# Patient Record
Sex: Male | Born: 1975 | Race: White | Hispanic: No | Marital: Single | Smoking: Current every day smoker
Health system: Southern US, Community
[De-identification: ages and names within clinical notes are randomized; demographics above are authoritative.]

## PROBLEM LIST (undated history)

## (undated) DIAGNOSIS — E079 Disorder of thyroid, unspecified: Secondary | ICD-10-CM

## (undated) DIAGNOSIS — R569 Unspecified convulsions: Secondary | ICD-10-CM

## (undated) DIAGNOSIS — F17200 Nicotine dependence, unspecified, uncomplicated: Secondary | ICD-10-CM

## (undated) DIAGNOSIS — T148XXA Other injury of unspecified body region, initial encounter: Secondary | ICD-10-CM

## (undated) DIAGNOSIS — F1910 Other psychoactive substance abuse, uncomplicated: Secondary | ICD-10-CM

## (undated) HISTORY — PX: ABDOMINAL SURGERY: SHX537

## (undated) SURGERY — Surgical Case
Anesthesia: *Unknown

---

## 2000-05-22 ENCOUNTER — Emergency Department (HOSPITAL_COMMUNITY): Admission: EM | Admit: 2000-05-22 | Discharge: 2000-05-22 | Payer: Self-pay | Admitting: *Deleted

## 2001-03-18 ENCOUNTER — Emergency Department (HOSPITAL_COMMUNITY): Admission: EM | Admit: 2001-03-18 | Discharge: 2001-03-18 | Payer: Self-pay | Admitting: Emergency Medicine

## 2003-09-08 ENCOUNTER — Emergency Department (HOSPITAL_COMMUNITY): Admission: EM | Admit: 2003-09-08 | Discharge: 2003-09-08 | Payer: Self-pay | Admitting: Emergency Medicine

## 2004-07-27 ENCOUNTER — Ambulatory Visit (HOSPITAL_COMMUNITY): Admission: RE | Admit: 2004-07-27 | Discharge: 2004-07-27 | Payer: Self-pay | Admitting: Family Medicine

## 2004-08-15 ENCOUNTER — Ambulatory Visit (HOSPITAL_COMMUNITY): Admission: RE | Admit: 2004-08-15 | Discharge: 2004-08-15 | Payer: Self-pay | Admitting: Family Medicine

## 2005-05-13 ENCOUNTER — Emergency Department (HOSPITAL_COMMUNITY): Admission: EM | Admit: 2005-05-13 | Discharge: 2005-05-13 | Payer: Self-pay | Admitting: Emergency Medicine

## 2005-06-26 ENCOUNTER — Emergency Department (HOSPITAL_COMMUNITY): Admission: EM | Admit: 2005-06-26 | Discharge: 2005-06-26 | Payer: Self-pay | Admitting: Emergency Medicine

## 2005-07-25 ENCOUNTER — Emergency Department (HOSPITAL_COMMUNITY): Admission: EM | Admit: 2005-07-25 | Discharge: 2005-07-25 | Payer: Self-pay | Admitting: Emergency Medicine

## 2006-01-31 ENCOUNTER — Emergency Department (HOSPITAL_COMMUNITY): Admission: EM | Admit: 2006-01-31 | Discharge: 2006-01-31 | Payer: Self-pay | Admitting: Emergency Medicine

## 2006-02-09 ENCOUNTER — Emergency Department (HOSPITAL_COMMUNITY): Admission: EM | Admit: 2006-02-09 | Discharge: 2006-02-09 | Payer: Self-pay | Admitting: Emergency Medicine

## 2006-08-15 ENCOUNTER — Emergency Department (HOSPITAL_COMMUNITY): Admission: EM | Admit: 2006-08-15 | Discharge: 2006-08-15 | Payer: Self-pay | Admitting: Emergency Medicine

## 2006-09-05 ENCOUNTER — Emergency Department (HOSPITAL_COMMUNITY): Admission: EM | Admit: 2006-09-05 | Discharge: 2006-09-05 | Payer: Self-pay | Admitting: Emergency Medicine

## 2006-10-09 ENCOUNTER — Emergency Department (HOSPITAL_COMMUNITY): Admission: EM | Admit: 2006-10-09 | Discharge: 2006-10-09 | Payer: Self-pay | Admitting: Emergency Medicine

## 2006-11-04 ENCOUNTER — Emergency Department (HOSPITAL_COMMUNITY): Admission: EM | Admit: 2006-11-04 | Discharge: 2006-11-04 | Payer: Self-pay | Admitting: Emergency Medicine

## 2006-11-07 ENCOUNTER — Emergency Department (HOSPITAL_COMMUNITY): Admission: EM | Admit: 2006-11-07 | Discharge: 2006-11-07 | Payer: Self-pay | Admitting: Emergency Medicine

## 2006-11-09 ENCOUNTER — Emergency Department (HOSPITAL_COMMUNITY): Admission: EM | Admit: 2006-11-09 | Discharge: 2006-11-09 | Payer: Self-pay | Admitting: Emergency Medicine

## 2006-12-07 ENCOUNTER — Emergency Department (HOSPITAL_COMMUNITY): Admission: EM | Admit: 2006-12-07 | Discharge: 2006-12-07 | Payer: Self-pay | Admitting: Emergency Medicine

## 2007-02-20 ENCOUNTER — Emergency Department (HOSPITAL_COMMUNITY): Admission: EM | Admit: 2007-02-20 | Discharge: 2007-02-20 | Payer: Self-pay | Admitting: Emergency Medicine

## 2007-09-05 ENCOUNTER — Emergency Department (HOSPITAL_COMMUNITY): Admission: EM | Admit: 2007-09-05 | Discharge: 2007-09-05 | Payer: Self-pay | Admitting: Emergency Medicine

## 2007-11-13 ENCOUNTER — Emergency Department (HOSPITAL_COMMUNITY): Admission: EM | Admit: 2007-11-13 | Discharge: 2007-11-13 | Payer: Self-pay | Admitting: Emergency Medicine

## 2008-01-14 ENCOUNTER — Emergency Department (HOSPITAL_COMMUNITY): Admission: EM | Admit: 2008-01-14 | Discharge: 2008-01-14 | Payer: Self-pay | Admitting: Emergency Medicine

## 2008-05-08 ENCOUNTER — Emergency Department (HOSPITAL_COMMUNITY): Admission: EM | Admit: 2008-05-08 | Discharge: 2008-05-08 | Payer: Self-pay | Admitting: Emergency Medicine

## 2008-07-30 ENCOUNTER — Emergency Department (HOSPITAL_COMMUNITY): Admission: EM | Admit: 2008-07-30 | Discharge: 2008-07-30 | Payer: Self-pay | Admitting: Emergency Medicine

## 2008-08-14 ENCOUNTER — Emergency Department (HOSPITAL_COMMUNITY): Admission: EM | Admit: 2008-08-14 | Discharge: 2008-08-14 | Payer: Self-pay | Admitting: Emergency Medicine

## 2008-08-16 ENCOUNTER — Emergency Department (HOSPITAL_COMMUNITY): Admission: EM | Admit: 2008-08-16 | Discharge: 2008-08-16 | Payer: Self-pay | Admitting: Emergency Medicine

## 2008-09-25 ENCOUNTER — Emergency Department (HOSPITAL_COMMUNITY): Admission: EM | Admit: 2008-09-25 | Discharge: 2008-09-25 | Payer: Self-pay | Admitting: Emergency Medicine

## 2008-10-07 ENCOUNTER — Emergency Department (HOSPITAL_COMMUNITY): Admission: EM | Admit: 2008-10-07 | Discharge: 2008-10-07 | Payer: Self-pay | Admitting: Emergency Medicine

## 2009-07-20 ENCOUNTER — Emergency Department (HOSPITAL_COMMUNITY): Admission: EM | Admit: 2009-07-20 | Discharge: 2009-07-20 | Payer: Self-pay | Admitting: Emergency Medicine

## 2009-09-01 ENCOUNTER — Inpatient Hospital Stay (HOSPITAL_COMMUNITY): Admission: AC | Admit: 2009-09-01 | Discharge: 2009-09-05 | Payer: Self-pay | Source: Home / Self Care

## 2009-09-06 ENCOUNTER — Emergency Department (HOSPITAL_COMMUNITY): Admission: EM | Admit: 2009-09-06 | Discharge: 2009-09-06 | Payer: Self-pay | Admitting: Emergency Medicine

## 2009-09-14 ENCOUNTER — Emergency Department (HOSPITAL_COMMUNITY): Admission: EM | Admit: 2009-09-14 | Discharge: 2009-09-14 | Payer: Self-pay | Admitting: Emergency Medicine

## 2009-09-26 ENCOUNTER — Emergency Department (HOSPITAL_COMMUNITY): Admission: EM | Admit: 2009-09-26 | Discharge: 2009-09-26 | Payer: Self-pay | Admitting: Emergency Medicine

## 2009-10-27 ENCOUNTER — Emergency Department (HOSPITAL_COMMUNITY): Admission: EM | Admit: 2009-10-27 | Discharge: 2009-10-27 | Payer: Self-pay | Admitting: Emergency Medicine

## 2009-10-29 ENCOUNTER — Emergency Department (HOSPITAL_COMMUNITY): Admission: EM | Admit: 2009-10-29 | Discharge: 2009-10-29 | Payer: Self-pay | Admitting: Emergency Medicine

## 2009-10-31 ENCOUNTER — Ambulatory Visit (HOSPITAL_COMMUNITY): Admission: RE | Admit: 2009-10-31 | Discharge: 2009-10-31 | Payer: Self-pay | Admitting: Emergency Medicine

## 2009-11-03 ENCOUNTER — Encounter: Admission: RE | Admit: 2009-11-03 | Discharge: 2009-11-03 | Payer: Self-pay | Admitting: Cardiothoracic Surgery

## 2009-11-03 ENCOUNTER — Ambulatory Visit: Payer: Self-pay | Admitting: Cardiothoracic Surgery

## 2010-04-28 LAB — COMPREHENSIVE METABOLIC PANEL
Albumin: 4.6 g/dL (ref 3.5–5.2)
Alkaline Phosphatase: 67 U/L (ref 39–117)
BUN: 11 mg/dL (ref 6–23)
Calcium: 9.6 mg/dL (ref 8.4–10.5)
Chloride: 100 mEq/L (ref 96–112)
Creatinine, Ser: 0.84 mg/dL (ref 0.4–1.5)
GFR calc non Af Amer: >60 mL/min (ref >60)
Glucose, Bld: 100 mg/dL — ABNORMAL HIGH (ref 70–99)
Potassium: 4.3 mEq/L (ref 3.5–5.1)
Total Bilirubin: 0.8 mg/dL (ref 0.3–1.2)

## 2010-04-28 LAB — CBC
HCT: 43.8 % (ref 39.0–52.0)
HCT: 48 % (ref 39.0–52.0)
Hemoglobin: 14.9 g/dL (ref 13.0–17.0)
MCH: 31.5 pg (ref 26.0–34.0)
MCH: 31.4 pg (ref 26.0–34.0)
MCHC: 34.1 g/dL (ref 30.0–36.0)
MCV: 93.8 fL (ref 78.0–100.0)
Platelets: 245 10*3/uL (ref 150–400)
Platelets: 245 10*3/uL (ref 150–400)
RBC: 4.73 MIL/uL (ref 4.22–5.81)
RBC: 5.12 MIL/uL (ref 4.22–5.81)
WBC: 9.5 10*3/uL (ref 4.0–10.5)

## 2010-04-28 LAB — DIFFERENTIAL
Basophils Absolute: 0.1 10*3/uL (ref 0.0–0.1)
Basophils Relative: 1 % (ref 0–1)
Basophils Relative: 1 % (ref 0–1)
Eosinophils Absolute: 0.1 10*3/uL (ref 0.0–0.7)
Lymphocytes Relative: 23 % (ref 12–46)
Lymphs Abs: 1.8 10*3/uL (ref 0.7–4.0)
Monocytes Absolute: 0.8 10*3/uL (ref 0.1–1.0)
Monocytes Absolute: 0.7 10*3/uL (ref 0.1–1.0)
Monocytes Relative: 7 % (ref 3–12)
Neutro Abs: 6.3 10*3/uL (ref 1.7–7.7)
Neutrophils Relative %: 67 % (ref 43–77)

## 2010-04-28 LAB — BASIC METABOLIC PANEL
CO2: 28 mEq/L (ref 19–32)
Chloride: 100 mEq/L (ref 96–112)
Glucose, Bld: 79 mg/dL (ref 70–99)
Potassium: 4.3 mEq/L (ref 3.5–5.1)
Sodium: 136 mEq/L (ref 135–145)

## 2010-04-28 LAB — URINALYSIS, ROUTINE W REFLEX MICROSCOPIC
Bilirubin Urine: NEGATIVE
Bilirubin Urine: NEGATIVE
Glucose, UA: NEGATIVE mg/dL
Glucose, UA: NEGATIVE mg/dL
Hgb urine dipstick: NEGATIVE
Ketones, ur: NEGATIVE mg/dL
Protein, ur: NEGATIVE mg/dL
Specific Gravity, Urine: 1.02 (ref 1.005–1.030)
Urobilinogen, UA: 0.2 mg/dL (ref 0.0–1.0)
pH: 5.5 (ref 5.0–8.0)

## 2010-04-28 LAB — LIPASE, BLOOD: Lipase: 31 U/L (ref 11–59)

## 2010-04-29 LAB — COMPREHENSIVE METABOLIC PANEL
ALT: 17 U/L (ref 0–53)
AST: 17 U/L (ref 0–37)
Albumin: 3.4 g/dL — ABNORMAL LOW (ref 3.5–5.2)
CO2: 27 mEq/L (ref 19–32)
Chloride: 104 mEq/L (ref 96–112)
Creatinine, Ser: 0.93 mg/dL (ref 0.4–1.5)
GFR calc Af Amer: >60 mL/min (ref >60)
GFR calc non Af Amer: >60 mL/min (ref >60)
Sodium: 138 mEq/L (ref 135–145)
Total Bilirubin: 0.1 mg/dL — ABNORMAL LOW (ref 0.3–1.2)

## 2010-04-29 LAB — BASIC METABOLIC PANEL
BUN: 4 mg/dL — ABNORMAL LOW (ref 6–23)
Chloride: 101 mEq/L (ref 96–112)
Creatinine, Ser: 0.83 mg/dL (ref 0.4–1.5)
Glucose, Bld: 99 mg/dL (ref 70–99)
Potassium: 4.3 mEq/L (ref 3.5–5.1)

## 2010-04-29 LAB — DIFFERENTIAL
Basophils Absolute: 0.1 10*3/uL (ref 0.0–0.1)
Basophils Absolute: 0.1 10*3/uL (ref 0.0–0.1)
Basophils Relative: 1 % (ref 0–1)
Eosinophils Absolute: 0.2 10*3/uL (ref 0.0–0.7)
Eosinophils Absolute: 0.2 10*3/uL (ref 0.0–0.7)
Eosinophils Relative: 1 % (ref 0–5)
Lymphocytes Relative: 14 % (ref 12–46)
Lymphs Abs: 1.9 10*3/uL (ref 0.7–4.0)
Monocytes Absolute: 0.9 10*3/uL (ref 0.1–1.0)
Monocytes Relative: 5 % (ref 3–12)
Neutro Abs: 8.8 10*3/uL — ABNORMAL HIGH (ref 1.7–7.7)
Neutrophils Relative %: 79 % — ABNORMAL HIGH (ref 43–77)

## 2010-04-29 LAB — CBC
HCT: 44.4 % (ref 39.0–52.0)
Hemoglobin: 11.2 g/dL — ABNORMAL LOW (ref 13.0–17.0)
MCH: 31 pg (ref 26.0–34.0)
MCH: 32.1 pg (ref 26.0–34.0)
MCHC: 32.9 g/dL (ref 30.0–36.0)
MCV: 94.2 fL (ref 78.0–100.0)
Platelets: 521 10*3/uL — ABNORMAL HIGH (ref 150–400)
RBC: 3.48 MIL/uL — ABNORMAL LOW (ref 4.22–5.81)
RDW: 13.4 % (ref 11.5–15.5)
WBC: 13.7 10*3/uL — ABNORMAL HIGH (ref 4.0–10.5)

## 2010-04-29 LAB — URINALYSIS, ROUTINE W REFLEX MICROSCOPIC
Bilirubin Urine: NEGATIVE
Glucose, UA: NEGATIVE mg/dL
Ketones, ur: NEGATIVE mg/dL
Protein, ur: NEGATIVE mg/dL
pH: 8 (ref 5.0–8.0)

## 2010-04-30 LAB — CBC
HCT: 31.3 % — ABNORMAL LOW (ref 39.0–52.0)
HCT: 35.9 % — ABNORMAL LOW (ref 39.0–52.0)
HCT: 39.3 % (ref 39.0–52.0)
Hemoglobin: 10.8 g/dL — ABNORMAL LOW (ref 13.0–17.0)
Hemoglobin: 12.3 g/dL — ABNORMAL LOW (ref 13.0–17.0)
Hemoglobin: 13.5 g/dL (ref 13.0–17.0)
MCH: 32.6 pg (ref 26.0–34.0)
MCHC: 34.3 g/dL (ref 30.0–36.0)
MCHC: 34.5 g/dL (ref 30.0–36.0)
Platelets: 176 10*3/uL (ref 150–400)
RBC: 3.77 MIL/uL — ABNORMAL LOW (ref 4.22–5.81)
RBC: 3.98 MIL/uL — ABNORMAL LOW (ref 4.22–5.81)
RDW: 12.4 % (ref 11.5–15.5)
RDW: 12.7 % (ref 11.5–15.5)
RDW: 12.7 % (ref 11.5–15.5)
WBC: 13 10*3/uL — ABNORMAL HIGH (ref 4.0–10.5)
WBC: 16.4 10*3/uL — ABNORMAL HIGH (ref 4.0–10.5)

## 2010-04-30 LAB — COMPREHENSIVE METABOLIC PANEL
CO2: 25 mEq/L (ref 19–32)
Calcium: 7.8 mg/dL — ABNORMAL LOW (ref 8.4–10.5)
Creatinine, Ser: 0.99 mg/dL (ref 0.4–1.5)
GFR calc Af Amer: >60 mL/min (ref >60)
GFR calc non Af Amer: >60 mL/min (ref >60)
Glucose, Bld: 110 mg/dL — ABNORMAL HIGH (ref 70–99)
Sodium: 141 mEq/L (ref 135–145)
Total Protein: 5.1 g/dL — ABNORMAL LOW (ref 6.0–8.3)

## 2010-04-30 LAB — TYPE AND SCREEN
ABO/RH(D): O POS
Antibody Screen: NEGATIVE

## 2010-04-30 LAB — BASIC METABOLIC PANEL
BUN: 3 mg/dL — ABNORMAL LOW (ref 6–23)
BUN: 7 mg/dL (ref 6–23)
Calcium: 6.9 mg/dL — ABNORMAL LOW (ref 8.4–10.5)
Chloride: 92 mEq/L — ABNORMAL LOW (ref 96–112)
Creatinine, Ser: 0.76 mg/dL (ref 0.4–1.5)
GFR calc Af Amer: >60 mL/min (ref >60)
GFR calc Af Amer: >60 mL/min (ref >60)
GFR calc non Af Amer: >60 mL/min (ref >60)
GFR calc non Af Amer: >60 mL/min (ref >60)
GFR calc non Af Amer: >60 mL/min (ref >60)
Glucose, Bld: 73 mg/dL (ref 70–99)
Potassium: 4 mEq/L (ref 3.5–5.1)
Potassium: 4.8 mEq/L (ref 3.5–5.1)
Sodium: 132 mEq/L — ABNORMAL LOW (ref 135–145)
Sodium: 134 mEq/L — ABNORMAL LOW (ref 135–145)

## 2010-04-30 LAB — APTT: aPTT: 24 seconds (ref 24–37)

## 2010-04-30 LAB — PROTIME-INR: Prothrombin Time: 13 seconds (ref 11.6–15.2)

## 2010-04-30 LAB — ABO/RH: ABO/RH(D): O POS

## 2010-04-30 LAB — LACTIC ACID, PLASMA: Lactic Acid, Venous: 3.3 mmol/L — ABNORMAL HIGH (ref 0.5–2.2)

## 2010-05-04 ENCOUNTER — Emergency Department (HOSPITAL_COMMUNITY)
Admission: EM | Admit: 2010-05-04 | Discharge: 2010-05-04 | Disposition: A | Discharge status: Home / Self Care | Payer: Self-pay | Attending: Emergency Medicine | Admitting: Emergency Medicine

## 2010-05-04 DIAGNOSIS — R109 Unspecified abdominal pain: Secondary | ICD-10-CM | POA: Insufficient documentation

## 2010-05-04 LAB — DIFFERENTIAL
Lymphocytes Relative: 28 % (ref 12–46)
Monocytes Absolute: 0.8 10*3/uL (ref 0.1–1.0)
Monocytes Relative: 8 % (ref 3–12)
Neutro Abs: 6.2 10*3/uL (ref 1.7–7.7)

## 2010-05-04 LAB — COMPREHENSIVE METABOLIC PANEL
CO2: 26 mEq/L (ref 19–32)
Calcium: 9 mg/dL (ref 8.4–10.5)
Creatinine, Ser: 0.92 mg/dL (ref 0.4–1.5)
GFR calc non Af Amer: >60 mL/min (ref >60)
Glucose, Bld: 95 mg/dL (ref 70–99)

## 2010-05-04 LAB — CBC
HCT: 44.7 % (ref 39.0–52.0)
Hemoglobin: 15.7 g/dL (ref 13.0–17.0)
MCH: 32.1 pg (ref 26.0–34.0)
MCHC: 35.1 g/dL (ref 30.0–36.0)

## 2010-05-04 LAB — LIPASE, BLOOD: Lipase: 35 U/L (ref 11–59)

## 2010-05-22 LAB — CULTURE, ROUTINE-ABSCESS

## 2010-05-26 LAB — CBC
MCHC: 33.9 g/dL (ref 30.0–36.0)
Platelets: 254 10*3/uL (ref 150–400)
RBC: 4.84 MIL/uL (ref 4.22–5.81)

## 2010-05-26 LAB — BASIC METABOLIC PANEL
BUN: 10 mg/dL (ref 6–23)
CO2: 29 mEq/L (ref 19–32)
Calcium: 9.4 mg/dL (ref 8.4–10.5)
Creatinine, Ser: 0.92 mg/dL (ref 0.4–1.5)
GFR calc Af Amer: >60 mL/min (ref >60)

## 2010-05-26 LAB — RAPID URINE DRUG SCREEN, HOSP PERFORMED
Amphetamines: NOT DETECTED
Barbiturates: NOT DETECTED
Benzodiazepines: POSITIVE — AB

## 2010-05-26 LAB — DIFFERENTIAL
Basophils Absolute: 0 10*3/uL (ref 0.0–0.1)
Basophils Relative: 1 % (ref 0–1)
Monocytes Relative: 7 % (ref 3–12)
Neutro Abs: 6.6 10*3/uL (ref 1.7–7.7)
Neutrophils Relative %: 73 % (ref 43–77)

## 2010-05-26 LAB — ETHANOL: Alcohol, Ethyl (B): <5 mg/dL (ref 0–10)
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
## 2010-09-24 IMAGING — CT CT ABD-PELV W/ CM
2 of 5 series · 16 of 46 positions shown, 18 images · IV contrast (Omnipaque 300)
Comparison: None.

CLINICAL DATA: Pain, trauma

CT ABDOMEN AND PELVIS WITH CONTRAST
TECHNIQUE: Multidetector CT imaging of the abdomen and pelvis was
performed following the standard protocol during bolus
administration of intravenous contrast.
Contrast: 100 ml of 2mnipaque-ZQQ

[Series 2: abd_pel_with 5.0 b40f · axial · 0.59mm/px · z∈[-453,-93]mm · 13 of 84 slices shown, 15 images]
[im 6/84  soft-tissue]
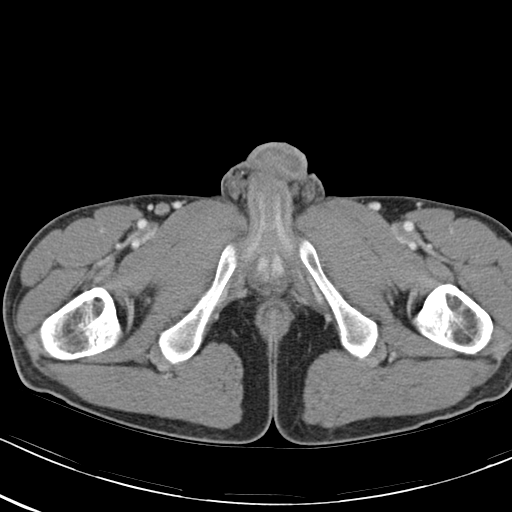
[im 6/84  bone]
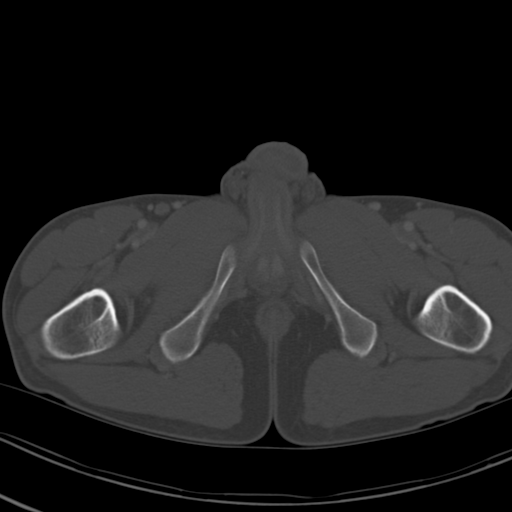
[im 11/84  soft-tissue]
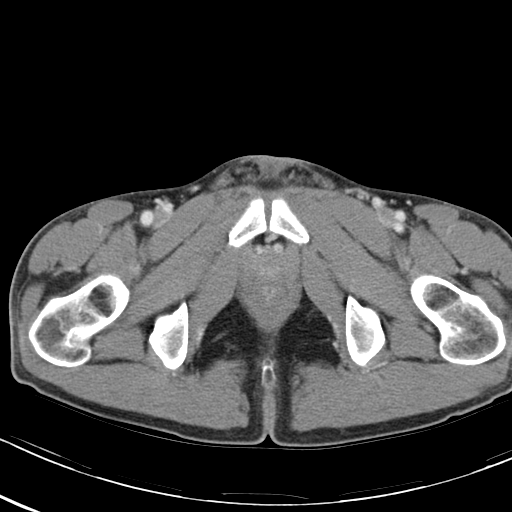
[im 16/84  soft-tissue]
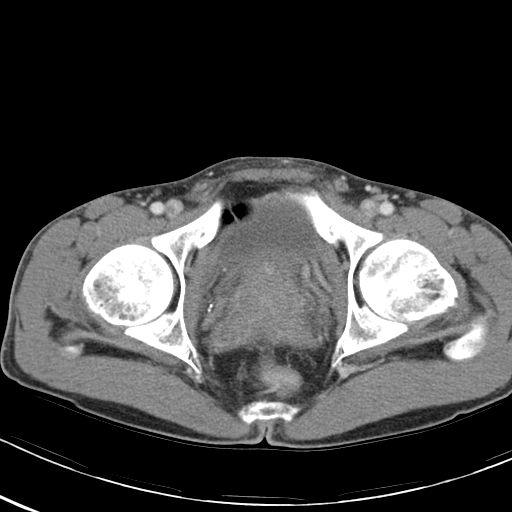
[im 26/84  soft-tissue]
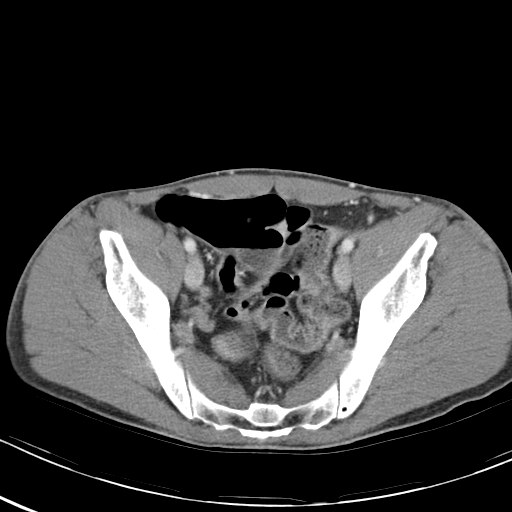
[im 32/84  soft-tissue]
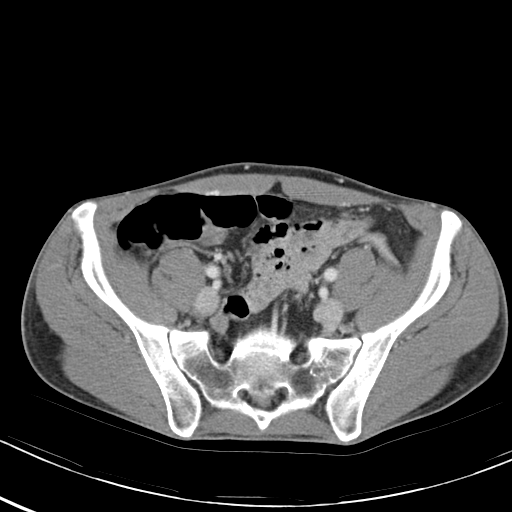
[im 37/84  soft-tissue]
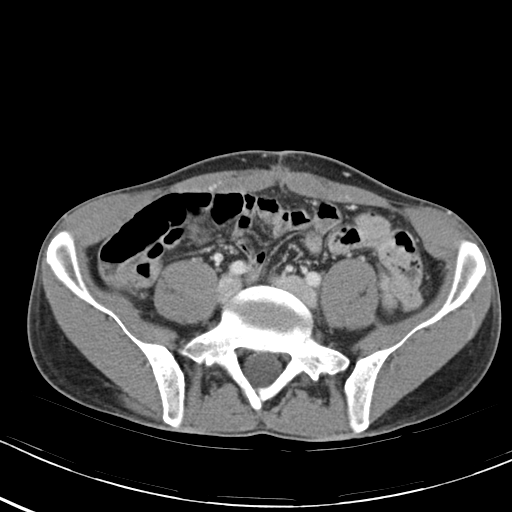
[im 42/84  soft-tissue]
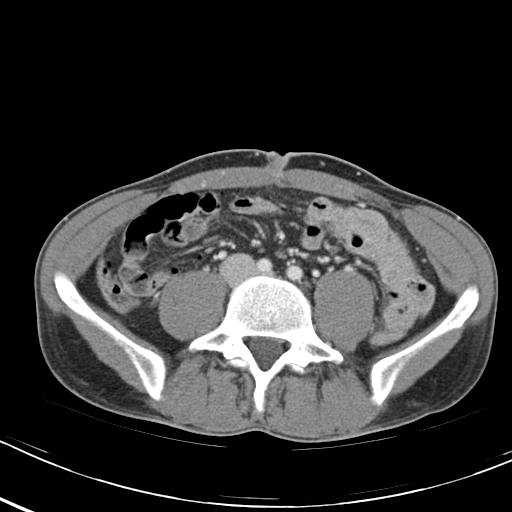
[im 47/84  soft-tissue]
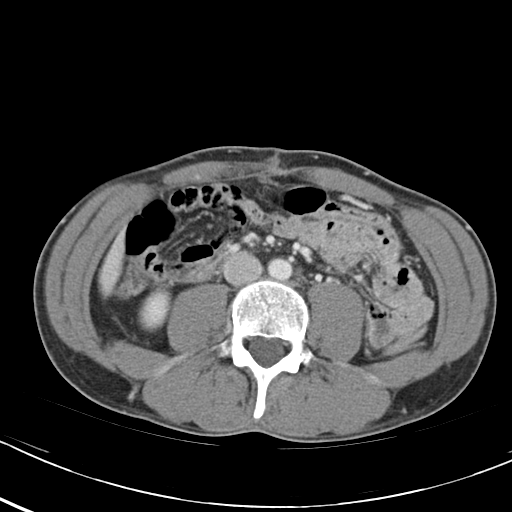
[im 52/84  soft-tissue]
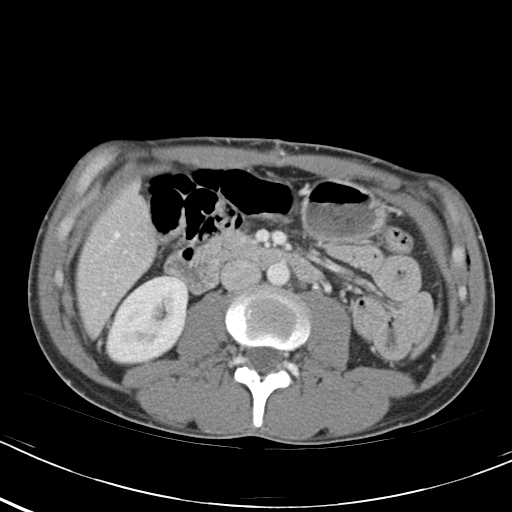
[im 52/84  bone]
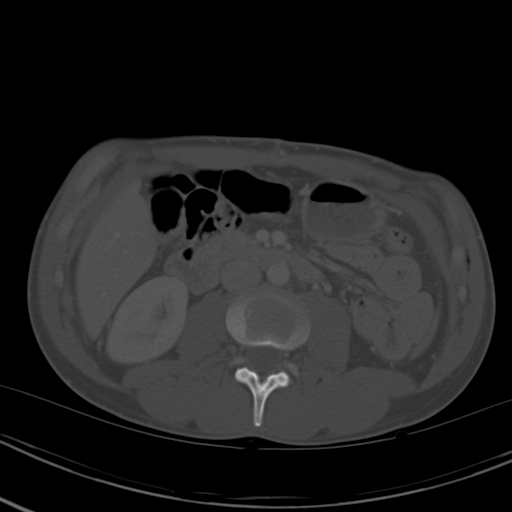
[im 58/84  soft-tissue]
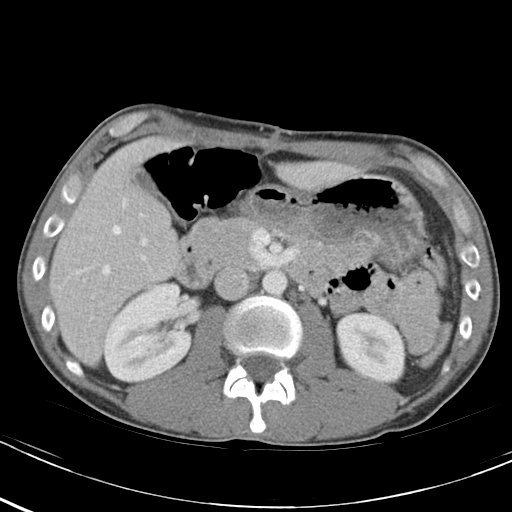
[im 68/84  soft-tissue]
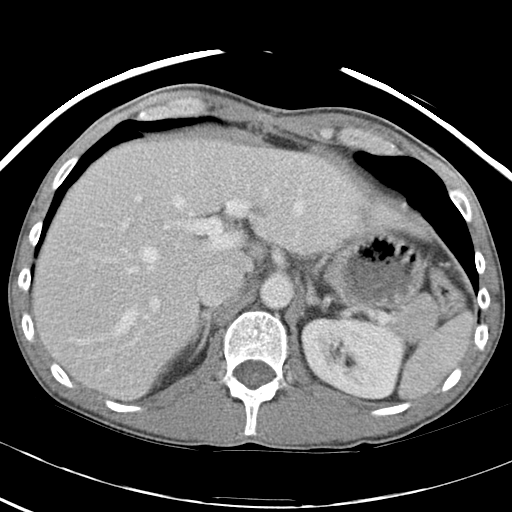
[im 73/84  soft-tissue]
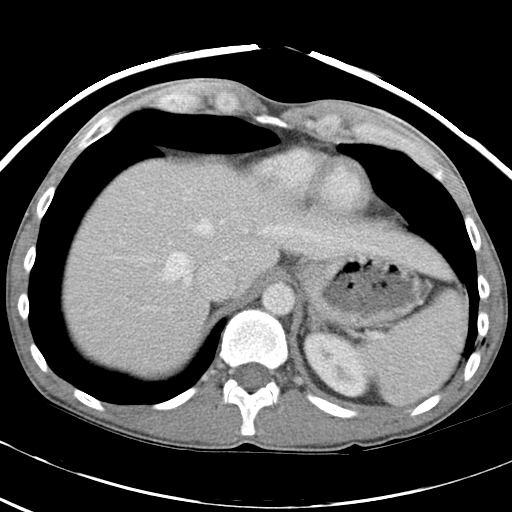
[im 78/84  soft-tissue]
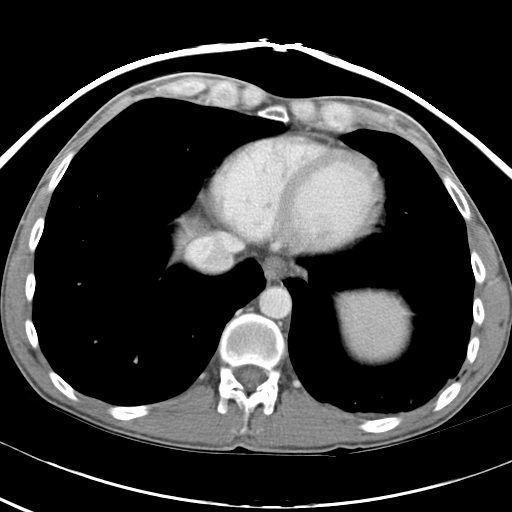

[Series 4: abd_pel_with 3.0 spo cor · coronal · 0.61mm/px · 3 of 59 slices shown]
[im 20/59  soft-tissue]
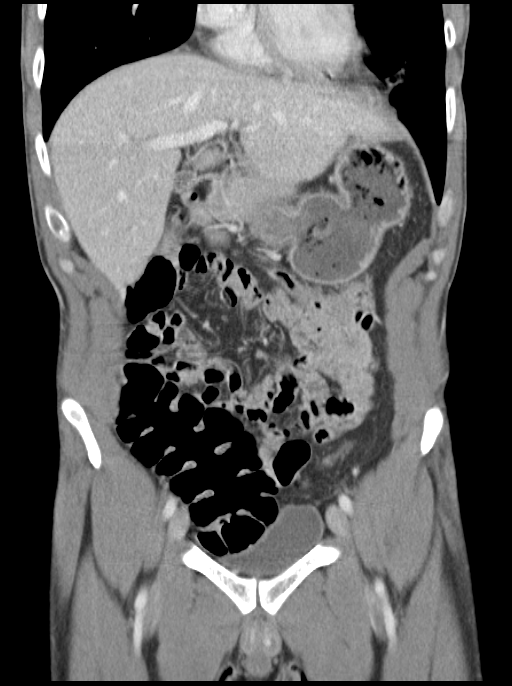
[im 26/59  soft-tissue]
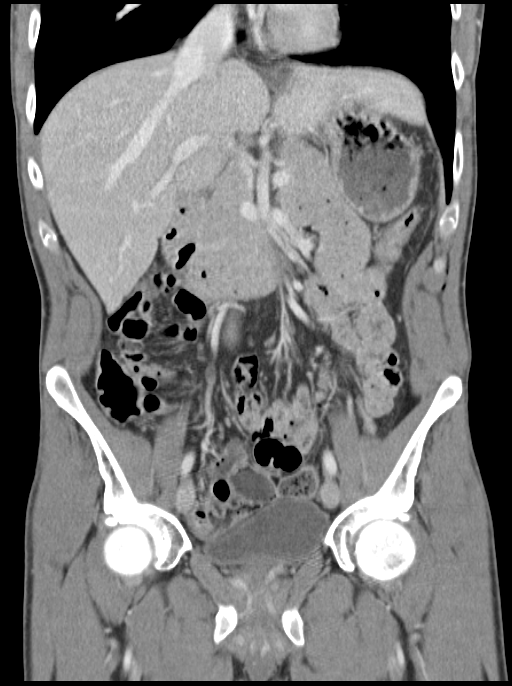
[im 33/59  soft-tissue]
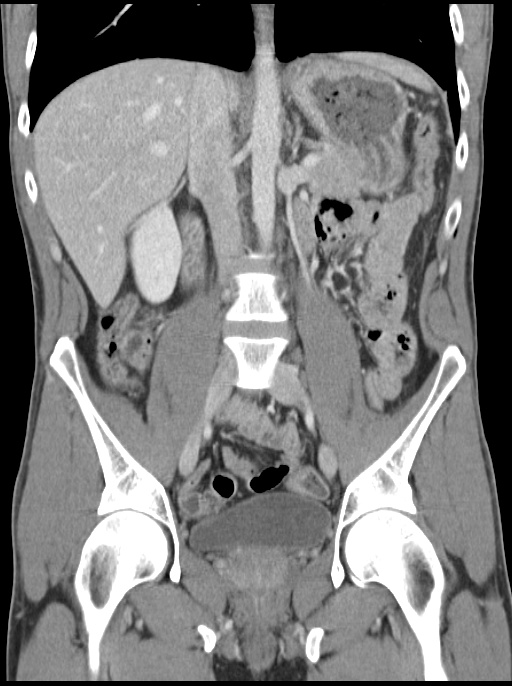

[16 of 46 positions shown; findings below may reference images not displayed]

FINDINGS: A pneumothorax is seen at the left lung base and small
locules of subcutaneous gas are seen between the lateral ninth and
tenth ribs.  There is a nondisplaced lateral 10th rib fracture.
No pleural effusions are present.  The lung bases otherwise clear.

The liver, gallbladder, spleen, adrenal glands and pancreas are
within normal limits.  The kidneys enhance symmetrically and there
is no hydronephrosis or perinephric stranding seen.  No small bowel
obstruction is present.  There is no extraluminal gas is seen.  The
colon is within normal limits.  The urinary bladder is mildly
distended but within normal limits.  The prostate and seminal
vesicles are unremarkable.  The soft tissues are normal.
IMPRESSION: 1.  Left-sided pleural effusion without evidence of mediastinal
shift.  Nondisplaced left 10th rib fracture.  Critical test results
telephoned to Dr. Zayas Tseng at the time of interpretation on [DATE] at
2.  No acute findings in the abdomen or pelvis.

## 2010-11-14 LAB — WOUND CULTURE: Gram Stain: NONE SEEN

## 2010-11-23 LAB — RAPID URINE DRUG SCREEN, HOSP PERFORMED
Barbiturates: NOT DETECTED
Opiates: NOT DETECTED

## 2010-11-23 LAB — URINALYSIS, ROUTINE W REFLEX MICROSCOPIC
Nitrite: NEGATIVE
Protein, ur: NEGATIVE
Specific Gravity, Urine: 1.025
Urobilinogen, UA: 0.2

## 2011-01-13 ENCOUNTER — Encounter: Payer: Self-pay | Admitting: *Deleted

## 2011-01-13 ENCOUNTER — Emergency Department (HOSPITAL_COMMUNITY)
Admission: EM | Admit: 2011-01-13 | Discharge: 2011-01-13 | Disposition: A | Discharge status: Home / Self Care | Payer: Self-pay | Attending: Emergency Medicine | Admitting: Emergency Medicine

## 2011-01-13 DIAGNOSIS — K089 Disorder of teeth and supporting structures, unspecified: Secondary | ICD-10-CM | POA: Insufficient documentation

## 2011-01-13 DIAGNOSIS — J069 Acute upper respiratory infection, unspecified: Secondary | ICD-10-CM | POA: Insufficient documentation

## 2011-01-13 DIAGNOSIS — F17200 Nicotine dependence, unspecified, uncomplicated: Secondary | ICD-10-CM | POA: Insufficient documentation

## 2011-01-13 DIAGNOSIS — E039 Hypothyroidism, unspecified: Secondary | ICD-10-CM | POA: Insufficient documentation

## 2011-01-13 DIAGNOSIS — J45909 Unspecified asthma, uncomplicated: Secondary | ICD-10-CM | POA: Insufficient documentation

## 2011-01-13 DIAGNOSIS — K029 Dental caries, unspecified: Secondary | ICD-10-CM | POA: Insufficient documentation

## 2011-01-13 DIAGNOSIS — K0889 Other specified disorders of teeth and supporting structures: Secondary | ICD-10-CM

## 2011-01-13 HISTORY — DX: Disorder of thyroid, unspecified: E07.9

## 2011-01-13 MED ORDER — AMOXICILLIN 500 MG PO CAPS: 500.0000 mg | ORAL_CAPSULE | Freq: Three times a day (TID) | ORAL | Status: AC

## 2011-01-13 MED ORDER — HYDROCODONE-ACETAMINOPHEN 5-325 MG PO TABS: ORAL_TABLET | ORAL | Status: AC

## 2011-01-13 NOTE — ED Notes (Signed)
Pt alert and oriented x 3. Skin warm and dry. Color pink. Breath sounds clear and equal bilaterally.

## 2011-01-13 NOTE — ED Notes (Signed)
Pt c/o pain in his lower right tooth x 1 month. Pt states that it has broken off and is hurting worse. Also c/o intermittent nausea and vomiting. C/o cough and congestion since this am.

## 2011-01-13 NOTE — ED Provider Notes (Signed)
History     CSN: [id number] Arrival date & time: 01/13/2011  9:12 AM   First MD Initiated Contact with Patient 01/13/11 [phone]      Chief Complaint  Patient presents with  . Dental Pain    (Consider location/radiation/quality/duration/timing/severity/associated sxs/prior treatment) HPI Comments: Patient c/o toothache for one month but pain worsened 3 days ago after a piece of the tooth broke off while eating.  He not been able to afford to see a dentist.  He also cough, nasal congestion and post-tussive emesis that began yesterday.  He denies fever, chest pain or SOB.   Patient is a 35 y.o. male presenting with tooth pain. The history is provided by the patient.  Dental PainThe primary symptoms include mouth pain and cough. Primary symptoms do not include dental injury, oral bleeding, oral lesions, headaches, fever, shortness of breath, sore throat or angioedema. The symptoms began 3 to 5 days ago. The symptoms are worsening. The symptoms are recurrent. The symptoms occur constantly.  Mouth pain began 3 - 5 days ago. Mouth pain occurs constantly. Mouth pain is worsening. Affected locations include: teeth and gum(s). The mouth pain is currently at 9/10.  The cough began yesterday. The cough is new. The cough is productive and vomit inducing. The sputum is clear. It is exacerbated by smoking.  Additional symptoms include: dental sensitivity to temperature and gum tenderness. Additional symptoms do not include: gum swelling, purulent gums, trismus, jaw pain, facial swelling, trouble swallowing, drooling, ear pain and fatigue. Medical issues include: smoking and periodontal disease.    Past Medical History  Diagnosis Date  . Asthma   . Thyroid disease     Past Surgical History  Procedure Date  . Abdominal surgery     History reviewed. No pertinent family history.  History  Substance Use Topics  . Smoking status: Current Everyday Smoker -- 0.5 packs/day  . Smokeless tobacco: Not on  file  . Alcohol Use: No      Review of Systems  Constitutional: Negative for fever, chills, activity change, appetite change and fatigue.  HENT: Positive for congestion, rhinorrhea, sneezing and dental problem. Negative for ear pain, sore throat, facial swelling, drooling, trouble swallowing, neck pain and neck stiffness.   Eyes: Negative for visual disturbance.  Respiratory: Positive for cough. Negative for shortness of breath and wheezing.   Cardiovascular: Negative for chest pain.  Gastrointestinal: Positive for nausea and vomiting. Negative for abdominal pain and abdominal distention.  Genitourinary: Negative for dysuria.  Musculoskeletal: Negative for myalgias, back pain and arthralgias.  Skin: Negative for rash.  Neurological: Negative for dizziness, facial asymmetry, weakness, numbness and headaches.  Hematological: Does not bruise/bleed easily.  All other systems reviewed and are negative.    Allergies  Review of patient's allergies indicates no known allergies.  Home Medications  No current outpatient prescriptions on file.  BP 130/69  Pulse 65  Temp(Src) 97.5 F (36.4 C) (Oral)  Resp 16  Ht 5\' 9"  (1.753 m)  Wt 125 lb (56.7 kg)  BMI 18.46 kg/m2  SpO2 100%  Physical Exam  Nursing note and vitals reviewed. Constitutional: He is oriented to person, place, and time. He appears well-developed and well-nourished. No distress.  HENT:  Head: Normocephalic and atraumatic. No trismus in the jaw.  Right Ear: Tympanic membrane and ear canal normal.  Left Ear: Tympanic membrane and ear canal normal.  Nose: Nose normal.  Mouth/Throat: Oropharynx is clear and moist and mucous membranes are normal. Dental caries present. No dental abscesses  or uvula swelling.    Eyes: EOM are normal. Pupils are equal, round, and reactive to light.  Neck: Normal range of motion. Neck supple.  Cardiovascular: Normal rate, regular rhythm and normal heart sounds.   Pulmonary/Chest: Effort  normal and breath sounds normal. No respiratory distress. He exhibits no tenderness.  Abdominal: Soft. He exhibits no distension and no mass. There is no tenderness. There is no rebound and no guarding.  Musculoskeletal: Normal range of motion. He exhibits no tenderness.  Lymphadenopathy:    He has no cervical adenopathy.  Neurological: He is alert and oriented to person, place, and time. No cranial nerve deficit. He exhibits normal muscle tone. Coordination normal.  Skin: Skin is warm and dry.    ED Course  Procedures (including critical care time)  Labs Reviewed - No data to display No results found.   No diagnosis found.    MDM   9:31 AM patient is alert, NAD, vitals stable.  No neck pain, trismus or facial swelling.Non-toxic appearing. Reports intermittent nausea and post-tussive emesis but abd is soft, NT and denies nausea at this time.  I will treat with PCN and pain medication and give referral for the dentist at [facility]      Diangelo Radel L. Dawn, [place] 01/13/11 [phone]

## 2011-01-15 NOTE — ED Provider Notes (Signed)
Medical screening examination/treatment/procedure(s) were performed by non-physician practitioner and as supervising physician I was immediately available for consultation/collaboration.   Shelda Jakes, MD 01/15/11 [phone]

## 2011-01-25 ENCOUNTER — Encounter (HOSPITAL_COMMUNITY): Payer: Self-pay

## 2011-01-25 ENCOUNTER — Emergency Department (HOSPITAL_COMMUNITY)
Admission: EM | Admit: 2011-01-25 | Discharge: 2011-01-25 | Disposition: A | Discharge status: Home / Self Care | Payer: Self-pay | Attending: Emergency Medicine | Admitting: Emergency Medicine

## 2011-01-25 DIAGNOSIS — R21 Rash and other nonspecific skin eruption: Secondary | ICD-10-CM

## 2011-01-25 DIAGNOSIS — E079 Disorder of thyroid, unspecified: Secondary | ICD-10-CM | POA: Insufficient documentation

## 2011-01-25 DIAGNOSIS — F17200 Nicotine dependence, unspecified, uncomplicated: Secondary | ICD-10-CM | POA: Insufficient documentation

## 2011-01-25 DIAGNOSIS — J45909 Unspecified asthma, uncomplicated: Secondary | ICD-10-CM | POA: Insufficient documentation

## 2011-01-25 DIAGNOSIS — K089 Disorder of teeth and supporting structures, unspecified: Secondary | ICD-10-CM | POA: Insufficient documentation

## 2011-01-25 DIAGNOSIS — K0889 Other specified disorders of teeth and supporting structures: Secondary | ICD-10-CM

## 2011-01-25 MED ORDER — PERMETHRIN 5 % EX CREA: TOPICAL_CREAM | CUTANEOUS | Status: AC

## 2011-01-25 NOTE — ED Notes (Signed)
Pt c/o rash to lower legs x 2 weeks.   Pt says rash is burning and itching.  Pt also reports has abscess tooth but can';t see a dentist until after Christmas.

## 2011-01-25 NOTE — ED Notes (Signed)
See triage note.

## 2011-01-25 NOTE — ED Provider Notes (Signed)
This chart was scribed for Joya Gaskins, MD by Wallis Mart. The patient was seen in room APA19/APA19 and the patient's care was started at 8:31 AM.    CSN: [id number] Arrival date & time: 01/25/2011  7:59 AM   First MD Initiated Contact with Patient 01/25/11 [phone]      Chief Complaint  Patient presents with  . Rash  . Dental Pain     Patient is a 35 y.o. male presenting with rash and tooth pain. The history is provided by the patient.  Rash  This is a new problem. The current episode started more than 1 week ago. The problem has been gradually worsening. The problem is associated with nothing. There has been no fever. The rash is present on the right lower leg and left lower leg. The pain is mild. The pain has been constant since onset. Associated symptoms include itching. He has tried nothing for the symptoms.  Dental PainPrimary symptoms do not include fever.   Pt reports that rash began 2 to 3 weeks ago.  Pt reports that his mother also had a rash associated with her medical hx.    Past Medical History  Diagnosis Date  . Asthma   . Thyroid disease     Past Surgical History  Procedure Date  . Abdominal surgery     No family history on file.  History  Substance Use Topics  . Smoking status: Current Everyday Smoker -- 0.5 packs/day  . Smokeless tobacco: Not on file  . Alcohol Use: No      Review of Systems  Constitutional: Negative for fever.  Skin: Positive for itching and rash.    Allergies  Review of patient's allergies indicates no known allergies.  Home Medications  No current outpatient prescriptions on file.  BP 100/58  Pulse 61  Temp(Src) 98.2 F (36.8 C) (Oral)  Resp 18  Ht 5\' 9"  (1.753 m)  Wt 125 lb (56.7 kg)  BMI 18.46 kg/m2  SpO2 97%  Physical Exam CONSTITUTIONAL: Well developed/well nourished HEAD AND FACE: Normocephalic/atraumatic EYES: EOMI/PERRL ENMT: Mucous membranes moist, no trismus, no dental abscess NECK: supple no  meningeal signs SPINE:entire spine nontender CV: S1/S2 noted, no murmurs/rubs/gallops noted LUNGS: Lungs are clear to auscultation bilaterally, no apparent distress ABDOMEN: soft, nontender, no rebound or guarding NEURO: Pt is awake/alert, moves all extremitiesx4 EXTREMITIES: pulses normal, full ROM SKIN: warm, color normal, healing scabs to right lower extremity, no bleeding/discharge noted PSYCH: no abnormalities of mood noted  ED Course  Procedures  DIAGNOSTIC STUDIES: Oxygen Saturation is 97% on room air, normal by my interpretation.    COORDINATION OF CARE:   Pt thinks this is scabies elimite given Advised him to f/u with dentist   MDM  Nursing notes reviewed and considered in documentation   I personally performed the services described in this documentation, which was scribed in my presence. The recorded information has been reviewed and considered.          Joya Gaskins, MD 01/25/11 [phone]

## 2011-02-02 ENCOUNTER — Encounter (HOSPITAL_COMMUNITY): Payer: Self-pay | Admitting: Emergency Medicine

## 2011-02-02 ENCOUNTER — Emergency Department (HOSPITAL_COMMUNITY)
Admission: EM | Admit: 2011-02-02 | Discharge: 2011-02-02 | Disposition: A | Discharge status: Home / Self Care | Payer: Self-pay | Attending: Emergency Medicine | Admitting: Emergency Medicine

## 2011-02-02 DIAGNOSIS — R21 Rash and other nonspecific skin eruption: Secondary | ICD-10-CM | POA: Insufficient documentation

## 2011-02-02 DIAGNOSIS — K029 Dental caries, unspecified: Secondary | ICD-10-CM | POA: Insufficient documentation

## 2011-02-02 DIAGNOSIS — K089 Disorder of teeth and supporting structures, unspecified: Secondary | ICD-10-CM | POA: Insufficient documentation

## 2011-02-02 DIAGNOSIS — K0889 Other specified disorders of teeth and supporting structures: Secondary | ICD-10-CM

## 2011-02-02 MED ORDER — NAPROXEN 250 MG PO TABS: 250.0000 mg | ORAL_TABLET | Freq: Two times a day (BID) | ORAL | Status: DC

## 2011-02-02 MED ORDER — HYDROCODONE-ACETAMINOPHEN 5-325 MG PO TABS: ORAL_TABLET | ORAL | Status: AC

## 2011-02-02 MED ORDER — PENICILLIN V POTASSIUM 250 MG PO TABS: 250.0000 mg | ORAL_TABLET | Freq: Four times a day (QID) | ORAL | Status: AC

## 2011-02-02 NOTE — ED Notes (Signed)
Patient c/o right lower dental pain and rash. Per patient seen here last week for both. Per patient neither one improving. Rash on legs and flanks bilaterally. Patient reports facial swelling and and ear pain due to tooth ache.

## 2011-02-02 NOTE — ED Provider Notes (Signed)
History     CSN: [id number]  Arrival date & time 02/02/11  1030   Chief Complaint  Patient presents with  . Dental Pain  . Rash    HPI Pt was seen at 1055.  Per pt, c/o gradual onset and persistence of constant right lower tooth "pain" ongoing for the past several months, worse over the past several days.  Pt has been eval in the ED multiple times for same.  States he is waiting for after the holidays to go to a dentist because he "spent my money on Christmas presents."  Denies fevers, no intra-oral edema, no rash, no dysphagia, no neck pain.  The condition is aggravated by nothing. The condition is relieved by nothing.  Pt also c/o gradual onset and persistence of constant bilat LE's red itching rash for the past month.  Pt was eval in ED for same 1 week, ago, tx Elimite without change.  States he is here today "for an abx for my tooth."      Past Medical History  Diagnosis Date  . Asthma   . Thyroid disease     Past Surgical History  Procedure Date  . Abdominal surgery     History  Substance Use Topics  . Smoking status: Current Everyday Smoker -- 0.5 packs/day for 15 years    Types: Cigarettes  . Smokeless tobacco: Never Used  . Alcohol Use: No    Review of Systems ROS: Statement: All systems negative except as marked or noted in the HPI; Constitutional: Negative for fever and chills. ; ; Eyes: Negative for eye pain and discharge. ; ; ENMT: Positive for dental caries, dental hygiene poor and toothache. Negative for ear pain, bleeding gums, dental injury, facial deformity, facial swelling, hoarseness, nasal congestion, sinus pressure, sore throat, throat swelling and tongue swollen. ; ; Cardiovascular: Negative for chest pain, palpitations, diaphoresis, dyspnea and peripheral edema. ; ; Respiratory: Negative for cough, wheezing and stridor. ; ; Gastrointestinal: Negative for nausea, vomiting, diarrhea and abdominal pain. ; ; Genitourinary: Negative for dysuria, flank pain and  hematuria. ; ; Musculoskeletal: Negative for back pain and neck pain. ; ; Skin: +rash and skin lesion. ; ; Neuro: Negative for headache, lightheadedness and neck stiffness.    Allergies  Other  Home Medications  No current outpatient prescriptions on file.  BP 127/78  Pulse 70  Temp(Src) 97.8 F (36.6 C) (Oral)  Resp 18  Ht 5\' 9"  (1.753 m)  Wt 125 lb (56.7 kg)  BMI 18.46 kg/m2  SpO2 99%  Physical Exam 1100: Physical examination: Vital signs and O2 SAT: Reviewed; Constitutional: Well developed, Well nourished, Well hydrated, In no acute distress; Head and Face: Normocephalic, Atraumatic; Eyes: EOMI, PERRL, No scleral icterus; ENMT: Mouth and pharynx normal, Poor dentition, Widespread dental decay, Mucous membranes moist, +lower right 2nd molar with dental decay.  No gingival erythema, edema, fluctuance, or drainage.  No hoarse voice, no drooling, no stridor.  ; Neck: Trachea midline, Full range of motion,; Cardiovascular: Regular rate and rhythm,; Respiratory:  No wheezes, Normal respiratory effort/excursion; Chest: Nontender, Movement normal; Extremities: No deformity, +multiple healing scabs to RLE without drainage or bleeding; Neuro: AA&Ox3, Major CN grossly intact. Speech clear, gait steady. No gross focal motor deficits in extremities.; Skin: Color normal, No petechiae, Warm, Dry.   ED Course  Procedures   MDM  MDM Reviewed: nursing note, vitals and previous chart    Rash appears healing scabs at this time.  Denies further spread of rash after  using elimite.  Will tx for dental pain, encourage dental f/u.    Gordy Goar Allison Quarry, DO 02/03/11 [phone]

## 2011-02-04 ENCOUNTER — Encounter (HOSPITAL_COMMUNITY): Payer: Self-pay | Admitting: *Deleted

## 2011-02-04 ENCOUNTER — Emergency Department (HOSPITAL_COMMUNITY)
Admission: EM | Admit: 2011-02-04 | Discharge: 2011-02-04 | Disposition: A | Discharge status: Home / Self Care | Payer: Self-pay | Attending: Emergency Medicine | Admitting: Emergency Medicine

## 2011-02-04 DIAGNOSIS — Z79899 Other long term (current) drug therapy: Secondary | ICD-10-CM | POA: Insufficient documentation

## 2011-02-04 DIAGNOSIS — E079 Disorder of thyroid, unspecified: Secondary | ICD-10-CM | POA: Insufficient documentation

## 2011-02-04 DIAGNOSIS — I10 Essential (primary) hypertension: Secondary | ICD-10-CM | POA: Insufficient documentation

## 2011-02-04 DIAGNOSIS — T07XXXA Unspecified multiple injuries, initial encounter: Secondary | ICD-10-CM | POA: Insufficient documentation

## 2011-02-04 DIAGNOSIS — L509 Urticaria, unspecified: Secondary | ICD-10-CM | POA: Insufficient documentation

## 2011-02-04 DIAGNOSIS — X58XXXA Exposure to other specified factors, initial encounter: Secondary | ICD-10-CM | POA: Insufficient documentation

## 2011-02-04 DIAGNOSIS — T7840XA Allergy, unspecified, initial encounter: Secondary | ICD-10-CM | POA: Insufficient documentation

## 2011-02-04 DIAGNOSIS — R21 Rash and other nonspecific skin eruption: Secondary | ICD-10-CM | POA: Insufficient documentation

## 2011-02-04 DIAGNOSIS — J45909 Unspecified asthma, uncomplicated: Secondary | ICD-10-CM | POA: Insufficient documentation

## 2011-02-04 DIAGNOSIS — F17200 Nicotine dependence, unspecified, uncomplicated: Secondary | ICD-10-CM | POA: Insufficient documentation

## 2011-02-04 MED ORDER — PREDNISONE 10 MG PO TABS: 20.0000 mg | ORAL_TABLET | Freq: Every day | ORAL | Status: AC

## 2011-02-04 MED ORDER — PREDNISONE 20 MG PO TABS
60.0000 mg | ORAL_TABLET | Freq: Once | ORAL | Status: AC
  Administered 2011-02-04: 60 mg via ORAL
  Filled 2011-02-04: qty 3

## 2011-02-04 MED ORDER — FAMOTIDINE 20 MG PO TABS
20.0000 mg | ORAL_TABLET | Freq: Once | ORAL | Status: AC
  Administered 2011-02-04: 20 mg via ORAL
  Filled 2011-02-04: qty 1

## 2011-02-04 MED ORDER — DIPHENHYDRAMINE HCL 25 MG PO CAPS
50.0000 mg | ORAL_CAPSULE | Freq: Once | ORAL | Status: AC
  Administered 2011-02-04: 50 mg via ORAL
  Filled 2011-02-04: qty 2

## 2011-02-04 NOTE — ED Notes (Signed)
Pt reports sudden onset of generalized rash and itching starting approx 20 min ago

## 2011-02-04 NOTE — ED Provider Notes (Signed)
History     CSN: [id number]  Arrival date & time 02/04/11  0013   First MD Initiated Contact with Patient 02/04/11 0022      Chief Complaint  Patient presents with  . Allergic Reaction    (Consider location/radiation/quality/duration/timing/severity/associated sxs/prior treatment) HPI Comments: Derrick Lyons is a 35 y.o. male who presents to the Emergency Department complaining of allergic reaction that began tonight when he got home from work. Developed red rash to entire body with whelps and itching. Denies shortness of breath, chest pain, fever, chills. He was started two days ago on penicillin and naprosyn for dental pain. He has taken both earlier today. He has taken no medicines for the rash or itching.  Patient is a 35 y.o. male presenting with allergic reaction.  Allergic Reaction    Past Medical History  Diagnosis Date  . Asthma   . Thyroid disease     Past Surgical History  Procedure Date  . Abdominal surgery     No family history on file.  History  Substance Use Topics  . Smoking status: Current Everyday Smoker -- 0.5 packs/day for 15 years    Types: Cigarettes  . Smokeless tobacco: Never Used  . Alcohol Use: No      Review of Systems A 10 review of systems reviewed and are negative for acute change except as noted in the HPI. Allergies  Other  Home Medications   Current Outpatient Rx  Name Route Sig Dispense Refill  . ACETAMINOPHEN 500 MG PO TABS Oral Take 500 mg by mouth every 6 (six) hours as needed. For pain     . BENZOCAINE 10 % MT GEL Topical Apply 1 application topically as needed. Applied to the gum/teeth for tooth pain     . HYDROCODONE-ACETAMINOPHEN 5-325 MG PO TABS  1 or 2 tabs PO q4-6 hours prn pain 10 tablet 0  . NAPROXEN 250 MG PO TABS Oral Take 1 tablet (250 mg total) by mouth 2 (two) times daily with a meal. 14 tablet 0  . NAPROXEN SODIUM 220 MG PO TABS Oral Take 220 mg by mouth 2 (two) times daily as needed. For pain     .  PENICILLIN V POTASSIUM 250 MG PO TABS Oral Take 1 tablet (250 mg total) by mouth 4 (four) times daily. 20 tablet 0    BP 139/91  Pulse 102  Temp(Src) 97.8 F (36.6 C) (Oral)  Resp 20  Ht 5\' 9"  (1.753 m)  Wt 125 lb (56.7 kg)  BMI 18.46 kg/m2  SpO2 99%  Physical Exam  Nursing note and vitals reviewed. Constitutional: He is oriented to person, place, and time. He appears well-developed and well-nourished. He appears distressed.  HENT:  Head: Normocephalic.  Right Ear: External ear normal.  Left Ear: External ear normal.  Mouth/Throat: Oropharynx is clear and moist.       Poor dentition  Eyes: EOM are normal.  Neck: Normal range of motion.  Cardiovascular: Normal rate, normal heart sounds and intact distal pulses.   Pulmonary/Chest: Effort normal and breath sounds normal. No stridor. No respiratory distress. He has no wheezes. He has no rales.  Abdominal: Soft. Bowel sounds are normal.  Musculoskeletal: Normal range of motion.  Neurological: He is alert and oriented to person, place, and time. He has normal reflexes.  Skin: Rash noted. There is erythema.       Hives to back and abdomen. Erythematous rash diffuse. Excoriations to arms and buttocks from scratching.    ED  Course  Procedures (including critical care time)     MDM  Patient with sudden onset generalized allergic reaction without a known exposure or ingestion. Responded to prednisone, benadryl and pepcid. Pt feels improved after observation and/or treatment in ED.Pt stable in ED with no significant deterioration in condition.The patient appears reasonably screened and/or stabilized for discharge and I doubt any other medical condition or other [facility] requiring further screening, evaluation, or treatment in the ED at this time prior to discharge.  MDM Reviewed: nursing note, vitals and previous chart           Nicoletta Dress. Colon Branch, MD 02/04/11 [phone]

## 2011-02-22 ENCOUNTER — Emergency Department (HOSPITAL_COMMUNITY)
Admission: EM | Admit: 2011-02-22 | Discharge: 2011-02-22 | Disposition: A | Discharge status: Home / Self Care | Payer: Self-pay | Attending: Emergency Medicine | Admitting: Emergency Medicine

## 2011-02-22 ENCOUNTER — Encounter (HOSPITAL_COMMUNITY): Payer: Self-pay | Admitting: *Deleted

## 2011-02-22 DIAGNOSIS — J45909 Unspecified asthma, uncomplicated: Secondary | ICD-10-CM | POA: Insufficient documentation

## 2011-02-22 DIAGNOSIS — E079 Disorder of thyroid, unspecified: Secondary | ICD-10-CM | POA: Insufficient documentation

## 2011-02-22 DIAGNOSIS — F17200 Nicotine dependence, unspecified, uncomplicated: Secondary | ICD-10-CM | POA: Insufficient documentation

## 2011-02-22 DIAGNOSIS — K029 Dental caries, unspecified: Secondary | ICD-10-CM | POA: Insufficient documentation

## 2011-02-22 DIAGNOSIS — K0889 Other specified disorders of teeth and supporting structures: Secondary | ICD-10-CM

## 2011-02-22 DIAGNOSIS — K089 Disorder of teeth and supporting structures, unspecified: Secondary | ICD-10-CM | POA: Insufficient documentation

## 2011-02-22 MED ORDER — HYDROCODONE-ACETAMINOPHEN 5-500 MG PO CAPS: 1.0000 | ORAL_CAPSULE | Freq: Four times a day (QID) | ORAL | Status: DC | PRN

## 2011-02-22 MED ORDER — HYDROCODONE-ACETAMINOPHEN 5-325 MG PO TABS: 1.0000 | ORAL_TABLET | ORAL | Status: AC | PRN

## 2011-02-22 NOTE — ED Notes (Signed)
Dental pain x 1 mo.

## 2011-02-23 NOTE — ED Provider Notes (Signed)
History     CSN: [id number]  Arrival date & time 02/22/11  1248   First MD Initiated Contact with Patient 02/22/11 1304      Chief Complaint  Patient presents with  . Dental Pain    (Consider location/radiation/quality/duration/timing/severity/associated sxs/prior treatment) HPI Comments: Patient has a chronically painful decayed tooth for which he is waiting to get in at the free dental clinic, which he is told should happen in 2-3 weeks.  He has completed a course of penicillin within the past 10 days. He continues to have pain in the tooth without significant gum swelling or drainage.  Patient is a 36 y.o. male presenting with tooth pain. The history is provided by the patient.  Dental PainThe primary symptoms include mouth pain. Primary symptoms do not include headaches, fever, shortness of breath or sore throat. The symptoms began more than 1 week ago. The symptoms are worsening. The symptoms occur constantly.  Additional symptoms include: dental sensitivity to temperature, gum tenderness and jaw pain. Additional symptoms do not include: gum swelling and facial swelling. Medical issues include: smoking.    Past Medical History  Diagnosis Date  . Asthma   . Thyroid disease     Past Surgical History  Procedure Date  . Abdominal surgery     No family history on file.  History  Substance Use Topics  . Smoking status: Current Everyday Smoker -- 0.5 packs/day for 15 years    Types: Cigarettes  . Smokeless tobacco: Never Used  . Alcohol Use: No      Review of Systems  Constitutional: Negative for fever.  HENT: Positive for dental problem. Negative for congestion, sore throat, facial swelling and neck pain.   Eyes: Negative.   Respiratory: Negative for chest tightness and shortness of breath.   Cardiovascular: Negative for chest pain.  Gastrointestinal: Negative for nausea and abdominal pain.  Genitourinary: Negative.   Musculoskeletal: Negative for joint swelling and  arthralgias.  Skin: Negative.  Negative for rash and wound.  Neurological: Negative for dizziness, weakness, light-headedness, numbness and headaches.  Hematological: Negative.   Psychiatric/Behavioral: Negative.     Allergies  Other and Naproxen  Home Medications   Current Outpatient Rx  Name Route Sig Dispense Refill  . GOODYS BODY PAIN PO Oral Take 1 packet by mouth every 4 (four) hours. Pain    . BENZOCAINE 10 % MT GEL Topical Apply 1 application topically as needed. Applied to the gum/teeth for tooth pain     . OVER THE COUNTER MEDICATION Oral Take 2 tablets by mouth every 8 (eight) hours as needed. Pain    . HYDROCODONE-ACETAMINOPHEN 5-325 MG PO TABS Oral Take 1 tablet by mouth every 4 (four) hours as needed for pain. 30 tablet 0    BP 115/62  Pulse 62  Temp(Src) 98.1 F (36.7 C) (Oral)  Resp 16  SpO2 100%  Physical Exam  Nursing note and vitals reviewed. Constitutional: He is oriented to person, place, and time. He appears well-developed and well-nourished. No distress.  HENT:  Head: Normocephalic and atraumatic.  Right Ear: Tympanic membrane and external ear normal.  Left Ear: Tympanic membrane and external ear normal.  Mouth/Throat: Oropharynx is clear and moist and mucous membranes are normal. No oral lesions. Dental caries present. No dental abscesses.    Eyes: Conjunctivae are normal.  Neck: Normal range of motion. Neck supple.  Cardiovascular: Normal rate, regular rhythm, normal heart sounds and intact distal pulses.   Pulmonary/Chest: Effort normal and breath sounds normal.  Musculoskeletal: Normal range of motion.  Lymphadenopathy:    He has no cervical adenopathy.  Neurological: He is alert and oriented to person, place, and time.  Skin: Skin is warm and dry. No erythema.  Psychiatric: He has a normal mood and affect.    ED Course  Procedures (including critical care time)  Labs Reviewed - No data to display No results found.   1. Pain, dental         MDM  No evidence for abscess on todays exam.  Hydrocodone given for coverage until can be seen by the dental clinic for extraction of this tooth.        Candis Musa, PA 02/23/11 1655

## 2011-02-24 NOTE — ED Provider Notes (Signed)
Medical screening examination/treatment/procedure(s) were performed by non-physician practitioner and as supervising physician I was immediately available for consultation/collaboration.  Colvin Blatt S. Ayeza Therriault, MD 02/24/11 0543 

## 2011-04-16 ENCOUNTER — Encounter (HOSPITAL_COMMUNITY): Payer: Self-pay | Admitting: *Deleted

## 2011-04-16 ENCOUNTER — Emergency Department (HOSPITAL_COMMUNITY)
Admission: EM | Admit: 2011-04-16 | Discharge: 2011-04-16 | Disposition: A | Discharge status: Home / Self Care | Payer: Self-pay | Attending: Emergency Medicine | Admitting: Emergency Medicine

## 2011-04-16 DIAGNOSIS — K089 Disorder of teeth and supporting structures, unspecified: Secondary | ICD-10-CM | POA: Insufficient documentation

## 2011-04-16 DIAGNOSIS — K0889 Other specified disorders of teeth and supporting structures: Secondary | ICD-10-CM

## 2011-04-16 DIAGNOSIS — J45909 Unspecified asthma, uncomplicated: Secondary | ICD-10-CM | POA: Insufficient documentation

## 2011-04-16 MED ORDER — HYDROCODONE-ACETAMINOPHEN 5-325 MG PO TABS: 1.0000 | ORAL_TABLET | Freq: Four times a day (QID) | ORAL | Status: AC | PRN

## 2011-04-16 MED ORDER — IBUPROFEN 400 MG PO TABS
ORAL_TABLET | ORAL | Status: AC
  Administered 2011-04-16: 800 mg via ORAL
  Filled 2011-04-16: qty 2

## 2011-04-16 MED ORDER — IBUPROFEN 800 MG PO TABS
800.0000 mg | ORAL_TABLET | Freq: Once | ORAL | Status: AC
  Administered 2011-04-16: 800 mg via ORAL

## 2011-04-16 MED ORDER — PENICILLIN V POTASSIUM 500 MG PO TABS: 500.0000 mg | ORAL_TABLET | Freq: Four times a day (QID) | ORAL | Status: AC

## 2011-04-16 NOTE — ED Notes (Signed)
Pt c/o pain to tooth.

## 2011-04-16 NOTE — ED Provider Notes (Addendum)
History     CSN: [id number]  Arrival date & time 04/16/11  0125   First MD Initiated Contact with Patient 04/16/11 0131      Chief Complaint  Patient presents with  . Dental Pain    (Consider location/radiation/quality/duration/timing/severity/associated sxs/prior treatment) Patient is a 36 y.o. male presenting with tooth pain. The history is provided by the patient.  Dental PainThe primary symptoms include mouth pain. Primary symptoms do not include dental injury, oral bleeding, headaches, fever, shortness of breath, sore throat, angioedema or cough. The symptoms began more than 1 month ago. The symptoms are worsening. The symptoms occur constantly.  Additional symptoms do not include: gum swelling, jaw pain and trouble swallowing.   Agency in ED frequently for dental pain last time was January 9 also seen December 20 and November 30 17 has been involved is a recurrent problem patient has yet to see a dentist. Pain is 10 out of 10. No discharge no facial swelling  Past Medical History  Diagnosis Date  . Asthma   . Thyroid disease     Past Surgical History  Procedure Date  . Abdominal surgery     History reviewed. No pertinent family history.  History  Substance Use Topics  . Smoking status: Current Everyday Smoker -- 0.5 packs/day for 15 years    Types: Cigarettes  . Smokeless tobacco: Never Used  . Alcohol Use: No      Review of Systems  Constitutional: Negative for fever.  HENT: Negative for congestion, sore throat, trouble swallowing, neck pain and neck stiffness.   Eyes: Negative for redness.  Respiratory: Negative for cough and shortness of breath.   Cardiovascular: Negative for chest pain.  Gastrointestinal: Negative for nausea, vomiting and abdominal pain.  Genitourinary: Negative for dysuria.  Musculoskeletal: Negative for back pain.  Skin: Negative for rash.  Neurological: Negative for headaches.  Hematological: Does not bruise/bleed easily.     Allergies  Other and Naproxen  Home Medications   Current Outpatient Rx  Name Route Sig Dispense Refill  . BENZOCAINE 10 % MT GEL Topical Apply 1 application topically as needed. Applied to the gum/teeth for tooth pain     . HYDROCODONE-ACETAMINOPHEN 5-325 MG PO TABS Oral Take 1-2 tablets by mouth every 6 (six) hours as needed for pain. 10 tablet 0  . OVER THE COUNTER MEDICATION Oral Take 2 tablets by mouth every 8 (eight) hours as needed. Pain    . PENICILLIN V POTASSIUM 500 MG PO TABS Oral Take 1 tablet (500 mg total) by mouth 4 (four) times daily. 40 tablet 0    BP 117/83  Pulse 72  Temp 97.7 F (36.5 C)  Resp 20  Wt 130 lb (58.968 kg)  SpO2 100%  Physical Exam  Nursing note and vitals reviewed. Constitutional: He is oriented to person, place, and time. He appears well-developed and well-nourished. No distress.  HENT:  Head: Normocephalic and atraumatic.  Mouth/Throat: Oropharynx is clear and moist.       Right lower molar with tenderness no gum swelling no facial swelling  Eyes: Conjunctivae and EOM are normal. Pupils are equal, round, and reactive to light.  Neck: Normal range of motion. Neck supple.  Cardiovascular: Normal rate, regular rhythm and normal heart sounds.   No murmur heard. Pulmonary/Chest: Effort normal and breath sounds normal. No respiratory distress.  Abdominal: Soft. Bowel sounds are normal. There is no tenderness.  Musculoskeletal: Normal range of motion.  Lymphadenopathy:    He has no cervical adenopathy.  Neurological: He is alert and oriented to person, place, and time. No cranial nerve deficit. He exhibits normal muscle tone. Coordination normal.  Skin: Skin is warm. No rash noted. No erythema.    ED Course  Procedures (including critical care time)  Labs Reviewed - No data to display No results found.   1. Toothache       MDM  Right lower molar with tenderness is a chronic problem patient was seen for this before we'll treat  with penicillin and some mycotic pain medicine referral provided to find  A dentist         Shelda Jakes, MD 04/16/11 1610  Shelda Jakes, MD 04/16/11 [phone]

## 2011-04-16 NOTE — Discharge Instructions (Signed)
Dental Pain Toothache is pain in or around a tooth. It may get worse with chewing or with cold or heat.  HOME CARE  Your dentist may use a numbing medicine during treatment. If so, you may need to avoid eating until the medicine wears off. Ask your dentist about this.   Only take medicine as told by your dentist or doctor.   Avoid chewing food near the painful tooth until after all treatment is done. Ask your dentist about this.  GET HELP RIGHT AWAY IF:   The problem gets worse or new problems appear.   You have a fever.   There is redness and puffiness (swelling) of the face, jaw, or neck.   You cannot open your mouth.   There is pain in the jaw.   There is very bad pain that is not helped by medicine.  MAKE SURE YOU:   Understand these instructions.   Will watch your condition.   Will get help right away if you are not doing well or get worse.  Document Released: 07/19/2007 Document Revised: 01/19/2011 Document Reviewed: 07/19/2007 [facility] Patient Information 2012 [place], [place].  RESOURCE GUIDE  Dental Problems  Patients with Medicaid: [facility] [id number]                                           [phone] [address] Phone:  [phone]                                                  Phone:  [phone]  If unable to pay or uninsured, contact:  [facility] or [facility]. to become qualified for the adult dental clinic.  Chronic Pain Problems Contact [facility]  [phone] Patients need to be referred by their primary care doctor.  Insufficient Money for Medicine Contact [employer]:  call "211" or [facility] 636-868-5296.  No Primary Care Doctor Call [facility]  [phone] Other agencies that provide inexpensive medical care    [facility]  [phone]    [facility]  [phone]    [facility]  [phone]    [facility]  Clinic  [phone]    [facility]  [phone]    [facility]  [phone]  Psychological Services [facility]  [phone] [facility] Services  [phone] [facility] Mental [facility]   [phone] (emergency services [phone])  Substance Abuse Resources [facility]  [phone] [facility] [phone] [facility] 8024051266 Floydene Flock [phone] Residential & Outpatient Substance Abuse Program  6048857381  Abuse/Neglect [facility] Child Abuse Hotline (435)256-8834 [facility] Child Abuse Hotline 334 685 4821 (After Hours)  Emergency Shelter [facility] [phone]  Maternity Homes [facility] [place] [facility] [phone] [facility] ([phone]  MRSA Hotline #:   (346) 448-9690    [facility] [place]  [facility] Dept. [address]      [place] Phone:  [phone]                                   Phone:  [phone]                 Phone:  [place] Phone:  [place] [phone] (After Hours)

## 2011-07-17 ENCOUNTER — Emergency Department (HOSPITAL_COMMUNITY)
Admission: EM | Admit: 2011-07-17 | Discharge: 2011-07-17 | Disposition: A | Discharge status: Home / Self Care | Payer: Self-pay | Attending: Emergency Medicine | Admitting: Emergency Medicine

## 2011-07-17 ENCOUNTER — Encounter (HOSPITAL_COMMUNITY): Payer: Self-pay | Admitting: *Deleted

## 2011-07-17 DIAGNOSIS — S01501A Unspecified open wound of lip, initial encounter: Secondary | ICD-10-CM | POA: Insufficient documentation

## 2011-07-17 DIAGNOSIS — Y92009 Unspecified place in unspecified non-institutional (private) residence as the place of occurrence of the external cause: Secondary | ICD-10-CM | POA: Insufficient documentation

## 2011-07-17 DIAGNOSIS — K0889 Other specified disorders of teeth and supporting structures: Secondary | ICD-10-CM | POA: Insufficient documentation

## 2011-07-17 DIAGNOSIS — S01511A Laceration without foreign body of lip, initial encounter: Secondary | ICD-10-CM

## 2011-07-17 DIAGNOSIS — F17200 Nicotine dependence, unspecified, uncomplicated: Secondary | ICD-10-CM | POA: Insufficient documentation

## 2011-07-17 MED ORDER — HYDROCODONE-ACETAMINOPHEN 5-325 MG PO TABS: 1.0000 | ORAL_TABLET | ORAL | Status: DC | PRN

## 2011-07-17 MED ORDER — HYDROCODONE-ACETAMINOPHEN 5-325 MG PO TABS
1.0000 | ORAL_TABLET | Freq: Once | ORAL | Status: AC
  Administered 2011-07-17: 1 via ORAL
  Filled 2011-07-17: qty 1
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
## 2011-07-17 NOTE — ED Notes (Signed)
Discharge instructions given and reviewed with patient.  Prescription given for Hydrocodone; effects and use explained.  Patient verbalized understanding of sedating effects of medication and to follow up with a dentist.  Patient ambulatory; discharged home in good condition.

## 2011-07-20 ENCOUNTER — Emergency Department (HOSPITAL_COMMUNITY)
Admission: EM | Admit: 2011-07-20 | Discharge: 2011-07-20 | Disposition: A | Discharge status: Home / Self Care | Payer: Self-pay | Attending: Emergency Medicine | Admitting: Emergency Medicine

## 2011-07-20 ENCOUNTER — Encounter (HOSPITAL_COMMUNITY): Payer: Self-pay | Admitting: Emergency Medicine

## 2011-07-20 ENCOUNTER — Emergency Department (HOSPITAL_COMMUNITY): Payer: Self-pay

## 2011-07-20 DIAGNOSIS — J342 Deviated nasal septum: Secondary | ICD-10-CM | POA: Insufficient documentation

## 2011-07-20 DIAGNOSIS — J45909 Unspecified asthma, uncomplicated: Secondary | ICD-10-CM | POA: Insufficient documentation

## 2011-07-20 DIAGNOSIS — J320 Chronic maxillary sinusitis: Secondary | ICD-10-CM | POA: Insufficient documentation

## 2011-07-20 DIAGNOSIS — S0993XA Unspecified injury of face, initial encounter: Secondary | ICD-10-CM | POA: Insufficient documentation

## 2011-07-20 DIAGNOSIS — E079 Disorder of thyroid, unspecified: Secondary | ICD-10-CM | POA: Insufficient documentation

## 2011-07-20 MED ORDER — IBUPROFEN 800 MG PO TABS
800.0000 mg | ORAL_TABLET | Freq: Once | ORAL | Status: AC
  Administered 2011-07-20: 800 mg via ORAL
  Filled 2011-07-20: qty 1

## 2011-07-20 MED ORDER — PENICILLIN V POTASSIUM 250 MG PO TABS
500.0000 mg | ORAL_TABLET | Freq: Once | ORAL | Status: AC
  Administered 2011-07-20: 500 mg via ORAL
  Filled 2011-07-20: qty 2

## 2011-07-20 MED ORDER — AMOXICILLIN-POT CLAVULANATE 875-125 MG PO TABS: 1.0000 | ORAL_TABLET | Freq: Two times a day (BID) | ORAL | Status: AC

## 2011-07-20 MED ORDER — HYDROCODONE-ACETAMINOPHEN 5-325 MG PO TABS
1.0000 | ORAL_TABLET | Freq: Once | ORAL | Status: AC
  Administered 2011-07-20: 1 via ORAL
  Filled 2011-07-20: qty 1

## 2011-07-20 MED ORDER — HYDROCODONE-ACETAMINOPHEN 5-325 MG PO TABS: 1.0000 | ORAL_TABLET | Freq: Four times a day (QID) | ORAL | Status: AC | PRN

## 2011-07-20 NOTE — ED Notes (Signed)
Pt seen here for same a few days ago. States ran out of pain meds and now tooth is hurting again. Needs to go to work today per patient.
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
## 2011-07-20 NOTE — ED Provider Notes (Signed)
Medical screening examination/treatment/procedure(s) were performed by non-physician practitioner and as supervising physician I was immediately available for consultation/collaboration.  Doug Sou, MD 07/20/11 [phone]

## 2011-07-20 NOTE — ED Notes (Signed)
Rick Miller, PA at bedside. 
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
## 2011-09-02 ENCOUNTER — Encounter (HOSPITAL_COMMUNITY): Payer: Self-pay | Admitting: *Deleted

## 2011-09-02 ENCOUNTER — Emergency Department (HOSPITAL_COMMUNITY)
Admission: EM | Admit: 2011-09-02 | Discharge: 2011-09-02 | Disposition: A | Discharge status: Home / Self Care | Payer: Self-pay | Attending: Emergency Medicine | Admitting: Emergency Medicine

## 2011-09-02 DIAGNOSIS — Z87891 Personal history of nicotine dependence: Secondary | ICD-10-CM | POA: Insufficient documentation

## 2011-09-02 DIAGNOSIS — F329 Major depressive disorder, single episode, unspecified: Secondary | ICD-10-CM

## 2011-09-02 DIAGNOSIS — F4321 Adjustment disorder with depressed mood: Secondary | ICD-10-CM

## 2011-09-02 DIAGNOSIS — F3289 Other specified depressive episodes: Secondary | ICD-10-CM | POA: Insufficient documentation

## 2011-09-02 LAB — CBC
HCT: 40.7 % (ref 39.0–52.0)
MCHC: 33.9 g/dL (ref 30.0–36.0)
Platelets: 249 10*3/uL (ref 150–400)
RDW: 12.4 % (ref 11.5–15.5)

## 2011-09-02 LAB — RAPID URINE DRUG SCREEN, HOSP PERFORMED
Amphetamines: NOT DETECTED
Barbiturates: NOT DETECTED
Benzodiazepines: POSITIVE — AB
Cocaine: NOT DETECTED

## 2011-09-02 LAB — BASIC METABOLIC PANEL
BUN: 9 mg/dL (ref 6–23)
CO2: 29 mEq/L (ref 19–32)
Chloride: 97 mEq/L (ref 96–112)
GFR calc Af Amer: >90 mL/min (ref >90)
Glucose, Bld: 91 mg/dL (ref 70–99)
Potassium: 3.9 mEq/L (ref 3.5–5.1)

## 2011-09-02 MED ORDER — LORAZEPAM 0.5 MG PO TABS: ORAL_TABLET | ORAL | Status: AC

## 2011-09-02 NOTE — ED Notes (Signed)
Warm blanket given to pt
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
## 2011-09-02 NOTE — ED Notes (Signed)
Pt requesting to be dc home at this time. Pt states, " I just wanna go home and sleep"
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
## 2011-11-21 ENCOUNTER — Emergency Department (HOSPITAL_COMMUNITY)
Admission: EM | Admit: 2011-11-21 | Discharge: 2011-11-21 | Disposition: A | Discharge status: Home / Self Care | Payer: Self-pay | Attending: Emergency Medicine | Admitting: Emergency Medicine

## 2011-11-21 ENCOUNTER — Emergency Department (HOSPITAL_COMMUNITY)
Admission: EM | Admit: 2011-11-21 | Discharge: 2011-11-21 | Disposition: A | Discharge status: Home / Self Care | Payer: Self-pay | Attending: Orthopaedic Surgery | Admitting: Orthopaedic Surgery

## 2011-11-21 ENCOUNTER — Emergency Department (HOSPITAL_COMMUNITY): Payer: Self-pay

## 2011-11-21 ENCOUNTER — Encounter (HOSPITAL_COMMUNITY): Payer: Self-pay | Admitting: *Deleted

## 2011-11-21 DIAGNOSIS — M25473 Effusion, unspecified ankle: Secondary | ICD-10-CM | POA: Insufficient documentation

## 2011-11-21 DIAGNOSIS — W108XXA Fall (on) (from) other stairs and steps, initial encounter: Secondary | ICD-10-CM | POA: Insufficient documentation

## 2011-11-21 DIAGNOSIS — S82899A Other fracture of unspecified lower leg, initial encounter for closed fracture: Secondary | ICD-10-CM | POA: Insufficient documentation

## 2011-11-21 DIAGNOSIS — J45909 Unspecified asthma, uncomplicated: Secondary | ICD-10-CM | POA: Insufficient documentation

## 2011-11-21 DIAGNOSIS — S8263XA Displaced fracture of lateral malleolus of unspecified fibula, initial encounter for closed fracture: Secondary | ICD-10-CM | POA: Insufficient documentation

## 2011-11-21 DIAGNOSIS — Z87891 Personal history of nicotine dependence: Secondary | ICD-10-CM | POA: Insufficient documentation

## 2011-11-21 DIAGNOSIS — S82892A Other fracture of left lower leg, initial encounter for closed fracture: Secondary | ICD-10-CM

## 2011-11-21 DIAGNOSIS — M25579 Pain in unspecified ankle and joints of unspecified foot: Secondary | ICD-10-CM | POA: Insufficient documentation

## 2011-11-21 DIAGNOSIS — M25476 Effusion, unspecified foot: Secondary | ICD-10-CM | POA: Insufficient documentation

## 2011-11-21 DIAGNOSIS — S8262XA Displaced fracture of lateral malleolus of left fibula, initial encounter for closed fracture: Secondary | ICD-10-CM

## 2011-11-21 MED ORDER — HYDROCODONE-ACETAMINOPHEN 5-325 MG PO TABS
2.0000 | ORAL_TABLET | Freq: Once | ORAL | Status: AC
  Administered 2011-11-21: 2 via ORAL
  Filled 2011-11-21: qty 2

## 2011-11-21 MED ORDER — HYDROCODONE-ACETAMINOPHEN 5-325 MG PO TABS: 1.0000 | ORAL_TABLET | ORAL | Status: DC | PRN

## 2011-11-21 MED ORDER — HYDROCODONE-ACETAMINOPHEN 7.5-325 MG PO TABS: 1.0000 | ORAL_TABLET | ORAL | Status: DC | PRN

## 2011-11-21 NOTE — ED Notes (Signed)
Pt c/o left sided ankle pain. Pt states he rolled his ankle while walking down some steps.

## 2011-11-21 NOTE — Consult Note (Signed)
Reason for Consult:Fracture lateral malleolus of the left ankle  Referring Physician: ER  Derrick Lyons is an 36 y.o. male.  HPI: He fell and twisted his left ankle last night.  He had pain and inability to ambulate.  X-rays here very early in the morning show a lateral malleolus fracture on the left with normal mortise.  He has no other injury.  He was placed in posterior splint and has crutches which he uses well.  Past Medical History  Diagnosis Date  . Asthma   . Thyroid disease     Past Surgical History  Procedure Date  . Abdominal surgery     No family history on file.  Social History:  reports that he quit smoking about 3 months ago. His smoking use included Cigarettes. He has a 7.5 pack-year smoking history. He has never used smokeless tobacco. He reports that he drinks alcohol. He reports that he does not use illicit drugs.  Allergies:  Allergies  Allergen Reactions  . Other Other (See Comments)    Cats cause allergy exacerbation   . Naproxen Rash    Medications: I have reviewed the patient's current medications.  No results found for this or any previous visit (from the past 48 hour(s)).  Dg Ankle Complete Left  11/21/2011  *RADIOLOGY REPORT*  Clinical Data: Rolled ankle walking down steps  LEFT ANKLE COMPLETE - 3+ VIEW  Comparison: None.  Findings:  There is extensive soft tissue swelling about the lateral malleolus.  This finding is associated with a minimally displaced comminuted fracture of the distal fibular metaphysis.  There is likely extension of the fracture into the distal tib-fib joint. There is no definitive disruption of the ankle mortise.  Small ankle joint effusion is suspected.  No radiopaque foreign body.  IMPRESSION: Comminuted, minimally displaced fracture of the distal fibular metaphysis with extension to the distal tib-fib joint.  No definite disruption of the ankle mortise.   Original Report Authenticated By: Derrick Lyons, M.D.     Review of  Systems  Musculoskeletal: Positive for joint pain (He fell and twisted his left ankle and hurt the left ankle.  He has posterior splint on and crutches.  He has no other injury.).  All other systems reviewed and are negative.   There were no vitals taken for this visit. Physical Exam  Constitutional: He is oriented to person, place, and time. He appears well-developed and well-nourished.  HENT:  Head: Normocephalic and atraumatic.  Eyes: Conjunctivae normal and EOM are normal. Pupils are equal, round, and reactive to light.  Neck: Normal range of motion. Neck supple.  Cardiovascular: Normal rate, regular rhythm and intact distal pulses.   Respiratory: Effort normal.  GI: Soft. Bowel sounds are normal.  Musculoskeletal: He exhibits tenderness (Left ankle pain.).       Feet:  Neurological: He is alert and oriented to person, place, and time. He has normal reflexes.  Skin: Skin is warm.  Psychiatric: He has a normal mood and affect. His behavior is normal. Judgment and thought content normal.    Assessment/Plan: Fracture of the lateral malleolus on the left.  To maintain the posterior splint and use the crutches.  I will give Rx for Norco 7.5  To be seen in my office next week with x-rays prior to the visit in the splint.  He is to elevate and ice the foot and ankle.  Continue no weight bearing.  Derrick Lyons 11/21/2011, 1:46 PM

## 2011-11-21 NOTE — ED Notes (Addendum)
Ankle injury lt ankle ,seen here yesterday for same.  Returned to see Dr Hilda Lias,  Dr Hilda Lias saw pt in hallway for injury and gave me the Rx and to d/c pt. Has splint in place and using crutches.

## 2011-11-21 NOTE — ED Provider Notes (Signed)
History     CSN: [id number]  Arrival date & time 11/21/11  0134   First MD Initiated Contact with Patient 11/21/11 [phone]      Chief Complaint  Patient presents with  . Ankle Pain    (Consider location/radiation/quality/duration/timing/severity/associated sxs/prior treatment) HPI Derrick Lyons is a 36 y.o. male who presents to the Emergency Department complaining of  Left ankle pain after falling while walking down stairs. He and his girlfriend had been drinking. He was walking down the porch steps when he twisted his ankle, inverting it as he fell. His girlfriend walking behind him fell on top of him further inverting the ankle. He is unable to bear weight on the foot without pain.   Past Medical History  Diagnosis Date  . Asthma   . Thyroid disease     Past Surgical History  Procedure Date  . Abdominal surgery     History reviewed. No pertinent family history.  History  Substance Use Topics  . Smoking status: Former Smoker -- 0.5 packs/day for 15 years    Types: Cigarettes    Quit date: 08/19/2011  . Smokeless tobacco: Never Used  . Alcohol Use: Yes      Review of Systems  Constitutional: Negative for fever.       10 Systems reviewed and are negative for acute change except as noted in the HPI.  HENT: Negative for congestion.   Eyes: Negative for discharge and redness.  Respiratory: Negative for cough and shortness of breath.   Cardiovascular: Negative for chest pain.  Gastrointestinal: Negative for vomiting and abdominal pain.  Musculoskeletal: Negative for back pain.       Left ankle pain  Skin: Negative for rash.  Neurological: Negative for syncope, numbness and headaches.  Psychiatric/Behavioral:       No behavior change.    Allergies  Other and Naproxen  Home Medications   Current Outpatient Rx  Name Route Sig Dispense Refill  . HYDROCODONE-ACETAMINOPHEN 5-325 MG PO TABS Oral Take 1 tablet by mouth every 4 (four) hours as needed for pain. 15  tablet 0    BP 109/73  Pulse 85  Temp 97.9 F (36.6 C) (Oral)  Resp 18  Ht 5\' 9"  (1.753 m)  Wt 125 lb (56.7 kg)  BMI 18.46 kg/m2  SpO2 100%  Physical Exam  Nursing note and vitals reviewed. Constitutional: He appears well-developed and well-nourished.       Awake, alert, nontoxic appearance.  HENT:  Head: Atraumatic.  Eyes: Right eye exhibits no discharge. Left eye exhibits no discharge.  Neck: Neck supple.  Cardiovascular: Normal heart sounds.   Pulmonary/Chest: Effort normal. He exhibits no tenderness.  Abdominal: Soft. There is no tenderness. There is no rebound.  Musculoskeletal: He exhibits no tenderness.       Baseline ROM, no obvious new focal weakness.Left ankle with marked soft tissue swelling to lateral malleolar area. Limited ROM due to pain. DP and PT pulses 2+  Neurological:       Mental status and motor strength appears baseline for patient and situation.  Skin: No rash noted.  Psychiatric: He has a normal mood and affect.    ED Course  Procedures (including critical care time)  Labs Reviewed - No data to display Dg Ankle Complete Left  11/21/2011  *RADIOLOGY REPORT*  Clinical Data: Rolled ankle walking down steps  LEFT ANKLE COMPLETE - 3+ VIEW  Comparison: None.  Findings:  There is extensive soft tissue swelling about the lateral malleolus.  This finding is associated with a minimally displaced comminuted fracture of the distal fibular metaphysis.  There is likely extension of the fracture into the distal tib-fib joint. There is no definitive disruption of the ankle mortise.  Small ankle joint effusion is suspected.  No radiopaque foreign body.  IMPRESSION: Comminuted, minimally displaced fracture of the distal fibular metaphysis with extension to the distal tib-fib joint.  No definite disruption of the ankle mortise.   Original Report Authenticated By: [name], M.D.      1. Ankle fracture, left       MDM  Patient s/p fall with comminuted  minimally displaced fracture of the distal fibula. Given analgesic. Placed in posterior splint by nursing  and given crutches. Referral to orthopedist. Dx testing d/w pt.  Questions answered.  Verb understanding, agreeable to d/c home with outpt f/u.Pt stable in ED with no significant deterioration in condition.The patient appears reasonably screened and/or stabilized for discharge and I doubt any other medical condition or other [facility] requiring further screening, evaluation, or treatment in the ED at this time prior to discharge.  MDM Reviewed: nursing note and vitals Interpretation: x-ray            Nicoletta Dress. Colon Branch, MD 11/21/11 [phone]

## 2011-11-21 NOTE — ED Notes (Signed)
Discharge instructions given and reviewed with patient.  Prescription given for Vicodin; effects and use explained.  Patient verbalized understanding of sedating effects of medication and to follow up with orthopedics as directed.  Patient discharged home in good condition.

## 2011-11-22 NOTE — ED Notes (Signed)
Pt came to e.d. Because ace wraps had loosened over the splint that was placed to his left ankle.  Re-wrapped with ace wraps and secured. No other complaints

## 2011-11-28 ENCOUNTER — Ambulatory Visit (HOSPITAL_COMMUNITY)
Admission: RE | Admit: 2011-11-28 | Discharge: 2011-11-28 | Disposition: A | Discharge status: Home / Self Care | Payer: Self-pay | Source: Ambulatory Visit | Attending: Orthopaedic Surgery | Admitting: Orthopaedic Surgery

## 2011-11-28 ENCOUNTER — Other Ambulatory Visit (HOSPITAL_COMMUNITY): Payer: Self-pay | Admitting: Orthopaedic Surgery

## 2011-11-28 DIAGNOSIS — S82899A Other fracture of unspecified lower leg, initial encounter for closed fracture: Secondary | ICD-10-CM

## 2011-12-20 ENCOUNTER — Ambulatory Visit (HOSPITAL_COMMUNITY)
Admission: RE | Admit: 2011-12-20 | Discharge: 2011-12-20 | Disposition: A | Discharge status: Home / Self Care | Payer: Self-pay | Source: Ambulatory Visit | Attending: Orthopaedic Surgery | Admitting: Orthopaedic Surgery

## 2011-12-20 ENCOUNTER — Other Ambulatory Visit (HOSPITAL_COMMUNITY): Payer: Self-pay | Admitting: Orthopaedic Surgery

## 2011-12-20 DIAGNOSIS — T148XXA Other injury of unspecified body region, initial encounter: Secondary | ICD-10-CM

## 2011-12-20 DIAGNOSIS — X58XXXA Exposure to other specified factors, initial encounter: Secondary | ICD-10-CM | POA: Insufficient documentation

## 2011-12-22 ENCOUNTER — Emergency Department (HOSPITAL_COMMUNITY)
Admission: EM | Admit: 2011-12-22 | Discharge: 2011-12-22 | Disposition: A | Discharge status: Home / Self Care | Payer: Self-pay | Attending: Emergency Medicine | Admitting: Emergency Medicine

## 2011-12-22 ENCOUNTER — Encounter (HOSPITAL_COMMUNITY): Payer: Self-pay | Admitting: *Deleted

## 2011-12-22 ENCOUNTER — Emergency Department (HOSPITAL_COMMUNITY): Payer: Self-pay

## 2011-12-22 ENCOUNTER — Encounter (HOSPITAL_COMMUNITY): Payer: Self-pay

## 2011-12-22 DIAGNOSIS — Z79899 Other long term (current) drug therapy: Secondary | ICD-10-CM | POA: Insufficient documentation

## 2011-12-22 DIAGNOSIS — Y9389 Activity, other specified: Secondary | ICD-10-CM | POA: Insufficient documentation

## 2011-12-22 DIAGNOSIS — Z8709 Personal history of other diseases of the respiratory system: Secondary | ICD-10-CM | POA: Insufficient documentation

## 2011-12-22 DIAGNOSIS — Z87891 Personal history of nicotine dependence: Secondary | ICD-10-CM | POA: Insufficient documentation

## 2011-12-22 DIAGNOSIS — Y929 Unspecified place or not applicable: Secondary | ICD-10-CM | POA: Insufficient documentation

## 2011-12-22 DIAGNOSIS — S5000XA Contusion of unspecified elbow, initial encounter: Secondary | ICD-10-CM | POA: Insufficient documentation

## 2011-12-22 DIAGNOSIS — W1789XA Other fall from one level to another, initial encounter: Secondary | ICD-10-CM | POA: Insufficient documentation

## 2011-12-22 DIAGNOSIS — L0231 Cutaneous abscess of buttock: Secondary | ICD-10-CM | POA: Insufficient documentation

## 2011-12-22 DIAGNOSIS — S0093XA Contusion of unspecified part of head, initial encounter: Secondary | ICD-10-CM

## 2011-12-22 DIAGNOSIS — G40802 Other epilepsy, not intractable, without status epilepticus: Secondary | ICD-10-CM | POA: Insufficient documentation

## 2011-12-22 DIAGNOSIS — L03317 Cellulitis of buttock: Secondary | ICD-10-CM | POA: Insufficient documentation

## 2011-12-22 DIAGNOSIS — S5002XA Contusion of left elbow, initial encounter: Secondary | ICD-10-CM

## 2011-12-22 DIAGNOSIS — J45909 Unspecified asthma, uncomplicated: Secondary | ICD-10-CM | POA: Insufficient documentation

## 2011-12-22 DIAGNOSIS — E0789 Other specified disorders of thyroid: Secondary | ICD-10-CM | POA: Insufficient documentation

## 2011-12-22 DIAGNOSIS — S0100XA Unspecified open wound of scalp, initial encounter: Secondary | ICD-10-CM | POA: Insufficient documentation

## 2011-12-22 DIAGNOSIS — S0003XA Contusion of scalp, initial encounter: Secondary | ICD-10-CM | POA: Insufficient documentation

## 2011-12-22 DIAGNOSIS — B356 Tinea cruris: Secondary | ICD-10-CM | POA: Insufficient documentation

## 2011-12-22 DIAGNOSIS — R569 Unspecified convulsions: Secondary | ICD-10-CM

## 2011-12-22 DIAGNOSIS — W19XXXA Unspecified fall, initial encounter: Secondary | ICD-10-CM

## 2011-12-22 DIAGNOSIS — S0101XA Laceration without foreign body of scalp, initial encounter: Secondary | ICD-10-CM

## 2011-12-22 HISTORY — DX: Unspecified convulsions: R56.9

## 2011-12-22 LAB — MAGNESIUM: Magnesium: 2.7 mg/dL — ABNORMAL HIGH (ref 1.5–2.5)

## 2011-12-22 LAB — CBC
MCHC: 35 g/dL (ref 30.0–36.0)
Platelets: 315 10*3/uL (ref 150–400)
RDW: 11.9 % (ref 11.5–15.5)
WBC: 18.4 10*3/uL — ABNORMAL HIGH (ref 4.0–10.5)

## 2011-12-22 LAB — ETHANOL: Alcohol, Ethyl (B): <11 mg/dL (ref 0–11)

## 2011-12-22 LAB — BASIC METABOLIC PANEL
CO2: 23 mEq/L (ref 19–32)
Calcium: 9.7 mg/dL (ref 8.4–10.5)
Creatinine, Ser: 0.91 mg/dL (ref 0.50–1.35)
GFR calc Af Amer: >90 mL/min (ref >90)
Sodium: 132 mEq/L — ABNORMAL LOW (ref 135–145)

## 2011-12-22 MED ORDER — LEVETIRACETAM 500 MG PO TABS: 500.0000 mg | ORAL_TABLET | Freq: Two times a day (BID) | ORAL | Status: DC

## 2011-12-22 MED ORDER — SODIUM CHLORIDE 0.9 % IV SOLN
1000.0000 mg | Freq: Once | INTRAVENOUS | Status: AC
  Administered 2011-12-22: 1000 mg via INTRAVENOUS
  Filled 2011-12-22: qty 10

## 2011-12-22 MED ORDER — LIDOCAINE-EPINEPHRINE (PF) 2 %-1:200000 IJ SOLN
INTRAMUSCULAR | Status: AC
  Administered 2011-12-22: 20 mL
  Filled 2011-12-22: qty 20

## 2011-12-22 MED ORDER — LEVETIRACETAM 500 MG/5ML IV SOLN
INTRAVENOUS | Status: AC
  Filled 2011-12-22: qty 10

## 2011-12-22 MED ORDER — LIDOCAINE-EPINEPHRINE 2 %-1:100000 IJ SOLN: 20.0000 mL | Freq: Once | INTRAMUSCULAR | Status: DC

## 2011-12-22 MED ORDER — OXYCODONE-ACETAMINOPHEN 5-325 MG PO TABS: 1.0000 | ORAL_TABLET | ORAL | Status: DC | PRN

## 2011-12-22 MED ORDER — NYSTATIN 100000 UNIT/GM EX CREA: TOPICAL_CREAM | CUTANEOUS | Status: DC

## 2011-12-22 MED ORDER — OXYCODONE-ACETAMINOPHEN 5-325 MG PO TABS
2.0000 | ORAL_TABLET | Freq: Once | ORAL | Status: AC
  Administered 2011-12-22: 2 via ORAL
  Filled 2011-12-22: qty 2

## 2011-12-22 MED ORDER — SULFAMETHOXAZOLE-TRIMETHOPRIM 800-160 MG PO TABS: 1.0000 | ORAL_TABLET | Freq: Two times a day (BID) | ORAL | Status: DC

## 2011-12-22 MED ORDER — LIDOCAINE HCL (PF) 1 % IJ SOLN
INTRAMUSCULAR | Status: AC
  Administered 2011-12-22: 09:00:00
  Filled 2011-12-22: qty 5

## 2011-12-22 NOTE — ED Provider Notes (Signed)
Medical screening examination/treatment/procedure(s) were performed by non-physician practitioner and as supervising physician I was immediately available for consultation/collaboration.   Fayelynn Distel, MD 12/22/11 1431 

## 2011-12-22 NOTE — ED Provider Notes (Signed)
History     CSN: [id number]  Arrival date & time 12/22/11  Derrick Lyons   First MD Initiated Contact with Patient 12/22/11 1930      Chief Complaint  Patient presents with  . Seizures    (Consider location/radiation/quality/duration/timing/severity/associated sxs/prior treatment) HPI Derrick Lyons is a 36 y.o. male  with complex medical history presenting sith seizure and fall this evening.  He says he hurt his head and has caused a headache, severity of moderate, around the left temporal-occipital region which radiates all around his head, not assoiciateed with neurologic deficits, photophobia or nausea.  PT got up from a chair with his crutches secondary to LLE fx in cast, had tonic clonic jerking movements and fell backward.  Pt relates tenuous memory of the incident and events thereafter.  He says he got up, "went after" his uncle, then walked around the outside of his house on his cast.  He was confronted by several family members asking if he was OK, then said he "shook it off" and became more lucent.  Headache is now mild.  He sustained pain to left elbow, left parietal and left temporal region above left ear with minimal bleeding and small laceration with mild left elbow pain.  He does not have any pain to casted left lower extremity.  Denies EtOH or illicit drugs.  Pt was here in the ED earlier today for abscess I&D of buttock. He says Percocet typically relieve his pain, but he left them at home.  Mother says he had a h/o seizures as a child. Pt says he has a hitsoty of seizures where he remembers parts of the events.  He never followed up with neurology and is not on seizure medicine.  He denies immunocompromise, fever, stiff neck, recent illness, cough, rhinorrhea or congestion, sinus pain or current neurologic deficits.  No chest pain, palpitations or dyspnea.  Past Medical History  Diagnosis Date  . Asthma   . Thyroid disease   . Seizures     Past Surgical History  Procedure Date  .  Abdominal surgery     History reviewed. No pertinent family history.  History  Substance Use Topics  . Smoking status: Former Smoker -- 0.5 packs/day for 15 years    Quit date: 08/19/2011  . Smokeless tobacco: Never Used  . Alcohol Use: No     Comment: denies      Review of Systems At least 10pt or greater review of systems completed and are negative except where specified in the HPI.  Allergies  Other and Naproxen  Home Medications   Current Outpatient Rx  Name  Route  Sig  Dispense  Refill  . OXYCODONE-ACETAMINOPHEN 5-325 MG PO TABS   Oral   Take 1 tablet by mouth every 4 (four) hours as needed for pain.   20 tablet   0   . SULFAMETHOXAZOLE-TRIMETHOPRIM 800-160 MG PO TABS   Oral   Take 1 tablet by mouth every 12 (twelve) hours.   28 tablet   0   . NYSTATIN 100000 UNIT/GM EX CREA      Apply to affected area of groin 2 times daily for up to 2 weeks   60 g   0     BP 129/84  Pulse 74  Temp 98.4 F (36.9 C) (Oral)  Resp 16  Ht 5\' 9"  (1.753 m)  Wt 130 lb (58.968 kg)  BMI 19.20 kg/m2  SpO2 100%  Physical Exam CONSTITUTIONAL: Awake, oriented x4, appears non-toxic  HENT: 1 cm "V" shaped laceration posterior to left ear - hemostatic - does not involve ear or cartilage - scalp laceration, normocephalic, oral mucosa pink and moist, airway patent. Nares patent without drainage. External ears normal.  EYES: Conjunctiva clear, EOMI, PERRLA, wears corrective lenses  NECK: Trachea midline, non-tender, supple  CARDIOVASCULAR: Normal heart rate, Normal rhythm, No murmurs, rubs, gallops  PULMONARY/CHEST: Clear to auscultation, no rhonchi, wheezes, or rales. Symmetrical breath sounds.  CHEST WALL: No lesions. Non-tender.  ABDOMINAL: Non-distended, soft, non-tender - no rebound or guarding. BS normal.  NEUROLOGIC: VO:[id number] fields intact. Facial sensation equal to light touch bilaterally. Good muscle bulk in the masseter muscle and good lateral movement of the jaw.  Facial expressions equal and good strength with smile/frown and puffed cheeks. Hearing grossly intact to finger rub test. Uvula, tongue are midline with no deviation. Symmetrical palate elevation. Trapezius and SCM muscles are 5/5 strength bilaterally.  DTR: Brachioradialis, biceps, patellar, Achilles tendon reflexes 2+ bilaterally. No clonus.  Strength: 5/5 strength flexors and extensors in the upper and lower extremities. Grip strength, finger adduction/abduction 5/5.  Sensation: Sensation intact distally to light touch  Cerebellar: No ataxia with walking or dysmetria with finger to nose, rapid alternating hand movements and heels to shin testing.  Gait and Station: Did not walk patient with cast for safety.  EXTREMITIES: No clubbing, cyanosis, or edema. Small contusion to left elbow, minimal TTP - distally neurovascularly intact. SKIN: Warm, Dry, No erythema, No rash  ED Course  LACERATION REPAIR Performed by: Jones Skene Authorized by: Jones Skene Consent: Verbal consent obtained. Laceration length: 1 cm Local anesthetic: lidocaine 2% with epinephrine Anesthetic total: 1 ml Patient sedated: no Preparation: Patient was prepped and draped in the usual sterile fashion. Irrigation solution: saline Amount of cleaning: standard Debridement: none Degree of undermining: none Wound skin closure material used: 6-0 Plain gut. Number of sutures: 3 Technique: simple Approximation: close Approximation difficulty: simple Patient tolerance: Patient tolerated the procedure well with no immediate complications.   (including critical care time)  Labs Reviewed  BASIC METABOLIC PANEL - Abnormal; Notable for the following:    Sodium 132 (*)     All other components within normal limits  CBC - Abnormal; Notable for the following:    WBC 18.4 (*)     All other components within normal limits  MAGNESIUM - Abnormal; Notable for the following:    Magnesium 2.7 (*)     All other components  within normal limits  ETHANOL  PROLACTIN   Ct Head Wo Contrast  12/22/2011  *RADIOLOGY REPORT*  Clinical Data: Seizures  CT HEAD WITHOUT CONTRAST  Technique:  Contiguous axial images were obtained from the base of the skull through the vertex without contrast.  Comparison: 07/20/2009  Findings: There is a left nasal septum deviation.  Mucosal thickening noted right ethmoid air cells.  No intracranial hemorrhage, mass effect or midline shift.  There is scalp swelling in the left parietal region axial image 24.  No hydrocephalus.  No intra or extra-axial fluid collection.  The gray and white matter differentiation is preserved.  No acute infarction.  No mass lesion is noted on this unenhanced scan.  IMPRESSION: No acute intracranial abnormality.  There is scalp swelling in the left parietal region.   Original Report Authenticated By: Natasha Mead, M.D.      1. Seizure   2. Fall   3. Head contusion   4. Left elbow contusion   5. Scalp laceration  Medications  levETIRAcetam (KEPPRA) 500 MG tablet (not administered)  oxyCODONE-acetaminophen (PERCOCET/ROXICET) 5-325 MG per tablet 2 tablet (2 tablet Oral Given 12/22/11 2036)  levETIRAcetam (KEPPRA) 1,000 mg in sodium chloride 0.9 % 100 mL IVPB (0 mg Intravenous Stopped 12/22/11 2312)  lidocaine-EPINEPHrine (XYLOCAINE W/EPI) 2 %-1:200000 (PF) injection (20 mL  Given 12/22/11 2220)    MDM  Teena Irani Dollard is fix 36 y.o. male w/ prior h/o seizures, previous abscess w/ IND today presents with seizure today.  No concerns for CNS infection in this patient. WBC count is elevated likely secondary to stress/demargination with seizure and IND earlier today.  Sodium is minimally decreased at 132 - I do not think this is clinically relevant - doubt this marginal decrease is responsible for seizure.  No bleed evident on CT - no mass, doubt SAH  -very low clinical suspicion w/o severe headache. Questionable h/o seizures, but pt is obviously concerned this evening.   Will start Keppra with IV load and po home Rx with close neurology follow up.  Pt has not followed up "in years" for seizure activity.  Advised pt follow up this time with neurology - he understands and will follow up.  I doubt this is classic epilepsy with his periods of lucidity surrounding event. Obtained prolactin to aid in further management for neurology.  Sutured laceration above left ear with plain gut.  Laceration guidelines given.    I explained the diagnosis and have given explicit precautions to return to the ER including focal neurologic deficits or any other new or worsening symptoms. The patient understands and accepts the medical plan as it's been dictated and I have answered their questions. Discharge instructions concerning home care and prescriptions have been given.  The patient is STABLE and is discharged to home in good condition.       Jones Skene, MD 12/23/11 2046

## 2011-12-22 NOTE — ED Notes (Signed)
Pt has "boil " to his butt, noted 2 days ago, not draining.

## 2011-12-22 NOTE — ED Notes (Signed)
Pt states that he had a seizure an hour ago, pt's uncle had witnessed the seizure like activity, pt states he was here earlier for an I&D to an abscess

## 2011-12-22 NOTE — ED Provider Notes (Signed)
History     CSN: [id number]  Arrival date & time 12/22/11  0808   First MD Initiated Contact with Patient 12/22/11 0820      Chief Complaint  Patient presents with  . Wound Check    (Consider location/radiation/quality/duration/timing/severity/associated sxs/prior treatment) HPI Comments: Derrick Lyons presents with an abscess to his left buttock which he first noticed 2 days ago and has become more tender and indurated.  He has had a rash in his groin additionally for the past several weeks which is slightly better after applying alcohol wipes.  It is mildly itchy without being overtly painful.  There has been no drainage from the abscess site or from his groin rash.  Pain at the abscess site is worse with palpation and with sitting.  The history is provided by the patient.    Past Medical History  Diagnosis Date  . Asthma   . Thyroid disease     Past Surgical History  Procedure Date  . Abdominal surgery     No family history on file.  History  Substance Use Topics  . Smoking status: Former Smoker -- 0.5 packs/day for 15 years    Types: Cigarettes    Quit date: 08/19/2011  . Smokeless tobacco: Never Used  . Alcohol Use: Yes      Review of Systems  Constitutional: Negative for fever and chills.  HENT: Negative for facial swelling.   Respiratory: Negative for shortness of breath and wheezing.   Skin: Positive for rash.  Neurological: Negative for numbness.    Allergies  Other and Naproxen  Home Medications   Current Outpatient Rx  Name  Route  Sig  Dispense  Refill  . NEOMYCIN-POLYMYXIN-PRAMOXINE 1 % EX CREA   Topical   Apply 1 application topically 2 (two) times daily as needed. Applies to affected areas         . HYDROCODONE-ACETAMINOPHEN 7.5-325 MG PO TABS   Oral   Take 1 tablet by mouth every 4 (four) hours as needed for pain (Ten days between refills.).   40 tablet   2   . NYSTATIN 100000 UNIT/GM EX CREA      Apply to affected area of  groin 2 times daily for up to 2 weeks   60 g   0   . OXYCODONE-ACETAMINOPHEN 5-325 MG PO TABS   Oral   Take 1 tablet by mouth every 4 (four) hours as needed for pain.   20 tablet   0   . SULFAMETHOXAZOLE-TRIMETHOPRIM 800-160 MG PO TABS   Oral   Take 1 tablet by mouth every 12 (twelve) hours.   28 tablet   0     BP 118/66  Pulse 81  Temp 98.3 F (36.8 C) (Oral)  Resp 20  Ht 5\' 9"  (1.753 m)  Wt 130 lb (58.968 kg)  BMI 19.20 kg/m2  SpO2 100%  Physical Exam  Constitutional: He appears well-developed and well-nourished. No distress.  HENT:  Head: Normocephalic.  Neck: Neck supple.  Cardiovascular: Normal rate.   Pulmonary/Chest: Effort normal. He has no wheezes.  Musculoskeletal: Normal range of motion. He exhibits no edema.  Skin: Rash noted.       Erythematous confluent rash bilateral groin with clearly defined,  Slight raised borders.  Excoriations present,  No drainage.  2 cm indurated abscess right mid buttock with a 2 cm surrounding area of erythema.  No spontaneous drainage.  Central dark punctum.    ED Course  Procedures (including critical care  time)  Labs Reviewed - No data to display Dg Ankle Complete Left  12/20/2011  *RADIOLOGY REPORT*  Clinical Data: Ankle fracture.  LEFT ANKLE COMPLETE - 3+ VIEW  Comparison: November 28, 2011.  Findings: These images demonstrate the joint to be casted and immobilized.  Mildly displaced comminuted fracture of the lateral malleolus is again noted and unchanged compared to prior exam.  No significant callus formation is seen currently. No significant change is seen involving the posterior malleolus fracture either.  IMPRESSION: No significant change in lateral malleolar or posterior malleolar fractures compared to prior exam.   Original Report Authenticated By: Lupita Raider.,  M.D.      1. Cellulitis and abscess of buttock   2. Tinea cruris    INCISION AND DRAINAGE Performed by: Burgess Amor Consent: Verbal consent  obtained. Risks and benefits: risks, benefits and alternatives were discussed Type: abscess  Body area: buttock  Anesthesia: local infiltration  Local anesthetic: lidocaine 1% without epinephrine  Anesthetic total: 3 ml  Complexity: complex Blunt dissection to break up loculations  Drainage: mostly bloody drainage,  Small amount of pus  Drainage amount: scant  Packing material: no packing Patient tolerance: Patient tolerated the procedure well with no immediate complications.      MDM  Start warm soaks this evening.  Bactrim,  Oxycodone.  Also prescribed nystatin cream for tinea.  PRN f/u        Burgess Amor, [place] 12/22/11 9604

## 2011-12-23 LAB — PROLACTIN: Prolactin: 2.9 ng/mL (ref 2.1–17.1)

## 2012-01-09 ENCOUNTER — Other Ambulatory Visit (HOSPITAL_COMMUNITY): Payer: Self-pay | Admitting: Orthopaedic Surgery

## 2012-01-09 ENCOUNTER — Ambulatory Visit (HOSPITAL_COMMUNITY)
Admission: RE | Admit: 2012-01-09 | Discharge: 2012-01-09 | Disposition: A | Discharge status: Home / Self Care | Payer: Self-pay | Source: Ambulatory Visit | Attending: Orthopaedic Surgery | Admitting: Orthopaedic Surgery

## 2012-01-09 DIAGNOSIS — S82899A Other fracture of unspecified lower leg, initial encounter for closed fracture: Secondary | ICD-10-CM | POA: Insufficient documentation

## 2012-01-09 DIAGNOSIS — T148XXA Other injury of unspecified body region, initial encounter: Secondary | ICD-10-CM

## 2012-01-09 DIAGNOSIS — X58XXXA Exposure to other specified factors, initial encounter: Secondary | ICD-10-CM | POA: Insufficient documentation

## 2012-01-26 ENCOUNTER — Ambulatory Visit (HOSPITAL_COMMUNITY)
Admission: RE | Admit: 2012-01-26 | Discharge: 2012-01-26 | Disposition: A | Discharge status: Home / Self Care | Payer: Self-pay | Source: Ambulatory Visit | Attending: Orthopaedic Surgery | Admitting: Orthopaedic Surgery

## 2012-01-26 ENCOUNTER — Other Ambulatory Visit (HOSPITAL_COMMUNITY): Payer: Self-pay | Admitting: Orthopaedic Surgery

## 2012-01-26 ENCOUNTER — Other Ambulatory Visit (HOSPITAL_COMMUNITY): Payer: Self-pay | Admitting: Hematology

## 2012-01-26 DIAGNOSIS — S82843A Displaced bimalleolar fracture of unspecified lower leg, initial encounter for closed fracture: Secondary | ICD-10-CM

## 2012-01-26 DIAGNOSIS — X58XXXA Exposure to other specified factors, initial encounter: Secondary | ICD-10-CM | POA: Insufficient documentation

## 2012-01-27 ENCOUNTER — Emergency Department (HOSPITAL_COMMUNITY)
Admission: EM | Admit: 2012-01-27 | Discharge: 2012-01-27 | Disposition: A | Discharge status: Home / Self Care | Payer: Self-pay | Attending: Emergency Medicine | Admitting: Emergency Medicine

## 2012-01-27 ENCOUNTER — Encounter (HOSPITAL_COMMUNITY): Payer: Self-pay

## 2012-01-27 DIAGNOSIS — Z8669 Personal history of other diseases of the nervous system and sense organs: Secondary | ICD-10-CM | POA: Insufficient documentation

## 2012-01-27 DIAGNOSIS — F17200 Nicotine dependence, unspecified, uncomplicated: Secondary | ICD-10-CM | POA: Insufficient documentation

## 2012-01-27 DIAGNOSIS — Z862 Personal history of diseases of the blood and blood-forming organs and certain disorders involving the immune mechanism: Secondary | ICD-10-CM | POA: Insufficient documentation

## 2012-01-27 DIAGNOSIS — Z8639 Personal history of other endocrine, nutritional and metabolic disease: Secondary | ICD-10-CM | POA: Insufficient documentation

## 2012-01-27 DIAGNOSIS — J45909 Unspecified asthma, uncomplicated: Secondary | ICD-10-CM | POA: Insufficient documentation

## 2012-01-27 DIAGNOSIS — L0231 Cutaneous abscess of buttock: Secondary | ICD-10-CM | POA: Insufficient documentation

## 2012-01-27 MED ORDER — OXYCODONE-ACETAMINOPHEN 5-325 MG PO TABS: ORAL_TABLET | ORAL | Status: DC

## 2012-01-27 MED ORDER — DOXYCYCLINE HYCLATE 100 MG PO CAPS: 100.0000 mg | ORAL_CAPSULE | Freq: Two times a day (BID) | ORAL | Status: DC

## 2012-01-27 MED ORDER — DOXYCYCLINE HYCLATE 100 MG PO TABS
100.0000 mg | ORAL_TABLET | Freq: Once | ORAL | Status: AC
  Administered 2012-01-27: 100 mg via ORAL
  Filled 2012-01-27: qty 1

## 2012-01-27 MED ORDER — OXYCODONE-ACETAMINOPHEN 5-325 MG PO TABS
1.0000 | ORAL_TABLET | Freq: Once | ORAL | Status: AC
  Administered 2012-01-27: 1 via ORAL
  Filled 2012-01-27: qty 1

## 2012-01-27 NOTE — ED Provider Notes (Signed)
History     CSN: [id number]  Arrival date & time 01/27/12  1344   First MD Initiated Contact with Patient 01/27/12 1420      Chief Complaint  Patient presents with  . Wound Check    (Consider location/radiation/quality/duration/timing/severity/associated sxs/prior treatment) HPI Comments: Abscess developing on R buttock over the past 4-5 days.  States he had an abscess near same spot ~ 3 weeks ago.  Denies fever or chills.  Not taking any pain meds.  No PCP  The history is provided by the patient. No language interpreter was used.    Past Medical History  Diagnosis Date  . Asthma   . Thyroid disease   . Seizures     Past Surgical History  Procedure Date  . Abdominal surgery     No family history on file.  History  Substance Use Topics  . Smoking status: Current Every Day Smoker -- 0.5 packs/day for 15 years    Types: Cigarettes    Last Attempt to Quit: 08/19/2011  . Smokeless tobacco: Never Used  . Alcohol Use: No     Comment: denies      Review of Systems  Constitutional: Negative for fever and chills.  Skin:       abscess  All other systems reviewed and are negative.    Allergies  Other and Naproxen  Home Medications   Current Outpatient Rx  Name  Route  Sig  Dispense  Refill  . DOXYCYCLINE HYCLATE 100 MG PO CAPS   Oral   Take 1 capsule (100 mg total) by mouth 2 (two) times daily.   28 capsule   0   . OXYCODONE-ACETAMINOPHEN 5-325 MG PO TABS      One tab po q 6 hrs prn pain   20 tablet   0     BP 107/77  Pulse 74  Temp 97.4 F (36.3 C) (Oral)  Resp 20  Ht 5\' 9"  (1.753 m)  Wt 130 lb (58.968 kg)  BMI 19.20 kg/m2  SpO2 100%  Physical Exam  Nursing note and vitals reviewed. Constitutional: He is oriented to person, place, and time. He appears well-developed and well-nourished.  HENT:  Head: Normocephalic and atraumatic.  Eyes: EOM are normal.  Neck: Normal range of motion.  Cardiovascular: Normal rate, regular rhythm and  intact distal pulses.   Pulmonary/Chest: Effort normal. No respiratory distress.  Abdominal: Soft. He exhibits no distension. There is no tenderness.  Musculoskeletal: Normal range of motion.       Back:  Neurological: He is alert and oriented to person, place, and time.  Skin: Skin is warm and dry.  Psychiatric: He has a normal mood and affect. Judgment normal.    ED Course  Procedures (including critical care time)  Labs Reviewed - No data to display Dg Ankle Complete Left  01/26/2012  *RADIOLOGY REPORT*  Clinical Data: Follow up fracture  LEFT ANKLE COMPLETE - 3+ VIEW  Comparison: 01/09/2012  Findings: Three views of the left ankle submitted.  There is a healed fracture distal left fibula.  Posterior malleolus fracture is healed.  No new fracture or subluxation.  IMPRESSION: Healed fracture in the distal fibula and posterior malleolus of the tibia with ankle mortise preserved.  No new fracture or subluxation.   Original Report Authenticated By: Natasha Mead, M.D.      1. Abscess of right buttock       MDM  rx-doxycycline, 28 rx-percocet, 20 Ibuprofen Warm compresses.  Evalina Field, [place] 01/27/12 [phone]

## 2012-01-27 NOTE — ED Notes (Signed)
Pt reports ?abcess to butt for 1 week, area is painful and more swollen today.

## 2012-01-27 NOTE — ED Provider Notes (Addendum)
Medical screening examination/treatment/procedure(s) were performed by non-physician practitioner and as supervising physician I was immediately available for consultation/collaboration. Devoria Albe, MD, FACEP   Ward Givens, MD 01/27/12 1610  Ward Givens, MD 01/27/12 [phone]

## 2012-02-25 ENCOUNTER — Emergency Department (HOSPITAL_COMMUNITY)
Admission: EM | Admit: 2012-02-25 | Discharge: 2012-02-25 | Disposition: A | Discharge status: Home / Self Care | Payer: Self-pay | Attending: Emergency Medicine | Admitting: Emergency Medicine

## 2012-02-25 ENCOUNTER — Encounter (HOSPITAL_COMMUNITY): Payer: Self-pay | Admitting: *Deleted

## 2012-02-25 DIAGNOSIS — Z8669 Personal history of other diseases of the nervous system and sense organs: Secondary | ICD-10-CM | POA: Insufficient documentation

## 2012-02-25 DIAGNOSIS — J45909 Unspecified asthma, uncomplicated: Secondary | ICD-10-CM | POA: Insufficient documentation

## 2012-02-25 DIAGNOSIS — F17200 Nicotine dependence, unspecified, uncomplicated: Secondary | ICD-10-CM | POA: Insufficient documentation

## 2012-02-25 DIAGNOSIS — Z79899 Other long term (current) drug therapy: Secondary | ICD-10-CM | POA: Insufficient documentation

## 2012-02-25 DIAGNOSIS — Z8639 Personal history of other endocrine, nutritional and metabolic disease: Secondary | ICD-10-CM | POA: Insufficient documentation

## 2012-02-25 DIAGNOSIS — Z862 Personal history of diseases of the blood and blood-forming organs and certain disorders involving the immune mechanism: Secondary | ICD-10-CM | POA: Insufficient documentation

## 2012-02-25 DIAGNOSIS — L0231 Cutaneous abscess of buttock: Secondary | ICD-10-CM | POA: Insufficient documentation

## 2012-02-25 MED ORDER — SULFAMETHOXAZOLE-TRIMETHOPRIM 800-160 MG PO TABS: 1.0000 | ORAL_TABLET | Freq: Two times a day (BID) | ORAL | Status: DC

## 2012-02-25 MED ORDER — OXYCODONE-ACETAMINOPHEN 5-325 MG PO TABS: 1.0000 | ORAL_TABLET | ORAL | Status: DC | PRN

## 2012-02-25 MED ORDER — OXYCODONE-ACETAMINOPHEN 5-325 MG PO TABS
2.0000 | ORAL_TABLET | Freq: Once | ORAL | Status: AC
  Administered 2012-02-25: 2 via ORAL
  Filled 2012-02-25: qty 2

## 2012-02-25 MED ORDER — LIDOCAINE HCL (PF) 1 % IJ SOLN
10.0000 mL | Freq: Once | INTRAMUSCULAR | Status: AC
  Administered 2012-02-25: 10 mL
  Filled 2012-02-25: qty 10

## 2012-02-25 MED ORDER — MUPIROCIN CALCIUM 2 % EX CREA: TOPICAL_CREAM | Freq: Two times a day (BID) | CUTANEOUS | Status: DC

## 2012-02-25 MED ORDER — SULFAMETHOXAZOLE-TMP DS 800-160 MG PO TABS
1.0000 | ORAL_TABLET | Freq: Once | ORAL | Status: AC
  Administered 2012-02-25: 1 via ORAL
  Filled 2012-02-25: qty 1

## 2012-02-25 NOTE — ED Provider Notes (Signed)
History     CSN: [id number]  Arrival date & time 02/25/12  1011   First MD Initiated Contact with Patient 02/25/12 1045      Chief Complaint  Patient presents with  . Abscess    (Consider location/radiation/quality/duration/timing/severity/associated sxs/prior treatment) Patient is a 37 y.o. male presenting with abscess. The history is provided by the patient.  Abscess  This is a recurrent problem. The current episode started less than one week ago. The onset was gradual. The problem occurs continuously. The problem has been gradually worsening. The abscess is present on the right buttock. The problem is moderate. The abscess is characterized by painfulness and swelling. It is unknown (He does have a history of MRSA and has had abscesses to the same buttock,  but no at the same location) what he was exposed to. Pertinent negatives include no fever. His past medical history does not include skin abscesses in family. There were no sick contacts.    Past Medical History  Diagnosis Date  . Asthma   . Thyroid disease   . Seizures     Past Surgical History  Procedure Date  . Abdominal surgery     No family history on file.  History  Substance Use Topics  . Smoking status: Current Every Day Smoker -- 0.5 packs/day for 15 years    Types: Cigarettes    Last Attempt to Quit: 08/19/2011  . Smokeless tobacco: Never Used  . Alcohol Use: No     Comment: denies      Review of Systems  Constitutional: Negative for fever and chills.  Respiratory: Negative for shortness of breath.   Skin: Positive for color change.  Neurological: Negative for numbness.    Allergies  Other and Naproxen  Home Medications   Current Outpatient Rx  Name  Route  Sig  Dispense  Refill  . BOIL-EASE EX   Apply externally   Apply 1 application topically as needed. To help with boil.         Marland Kitchen MUPIROCIN CALCIUM 2 % EX CREA   Topical   Apply topically 2 (two) times daily. Apply with q tip to  nostrils twice daily for 10 days   15 g   0   . OXYCODONE-ACETAMINOPHEN 5-325 MG PO TABS   Oral   Take 1 tablet by mouth every 4 (four) hours as needed for pain.   20 tablet   0   . SULFAMETHOXAZOLE-TRIMETHOPRIM 800-160 MG PO TABS   Oral   Take 1 tablet by mouth every 12 (twelve) hours.   28 tablet   0     BP 112/62  Pulse 65  Temp 98 F (36.7 C) (Oral)  Resp 16  SpO2 100%  Physical Exam  Constitutional: He is oriented to person, place, and time. He appears well-developed and well-nourished.  HENT:  Head: Normocephalic.  Cardiovascular: Normal rate.   Pulmonary/Chest: Effort normal.  Neurological: He is alert and oriented to person, place, and time. No sensory deficit.  Skin:       2 cm indurated abscess right mid buttock.  3 cm surrounding erythema.  Central pointing without drainage.    ED Course  Procedures (including critical care time)  Labs Reviewed - No data to display No results found.   1. Abscess of right buttock       MDM  Abscess with h/o mrsa.  Pt prescribed bactrim,  Also encouraged nasal tx with mupirocin - prescription given.  Already using antimicrobial soap. Encouraged  pt to remove packing in 2 days, then start warm epsom salt soaks 2 x daily.  Return for any problems.     INCISION AND DRAINAGE Performed by: Burgess Amor Consent: Verbal consent obtained. Risks and benefits: risks, benefits and alternatives were discussed Type: abscess  Body area: right buttock,  Above the location of previous abscesses.  Anesthesia: local infiltration  Incision was made with a scalpel.  Local anesthetic: lidocaine 1% without epinephrine  Anesthetic total: 5ml  Complexity: complex Blunt dissection to break up loculations  Drainage: purulent  Drainage amount: moderate  Packing material: 1/4 in iodoform gauze 1 inch only  Patient tolerance: Patient tolerated the procedure well with no immediate complications.        Burgess Amor,  [place] 02/25/12 2055

## 2012-02-25 NOTE — ED Notes (Signed)
Pt c/o abscess to right buttock area that he first noticed three days ago,

## 2012-02-25 NOTE — ED Notes (Signed)
Set up for I&D at bedside.

## 2012-02-25 NOTE — ED Notes (Signed)
telfa dressing placed over I&D site.

## 2012-02-25 NOTE — ED Notes (Signed)
Left in c/o family for transport home; a&ox4; in no apparent distress.  Instructions, prescriptions and f/u information given/reviewed. Verbalizes understanding.

## 2012-02-26 NOTE — ED Provider Notes (Signed)
Medical screening examination/treatment/procedure(s) were performed by non-physician practitioner and as supervising physician I was immediately available for consultation/collaboration.  Trevone Prestwood, MD 02/26/12 2211 

## 2012-05-27 ENCOUNTER — Emergency Department (HOSPITAL_COMMUNITY)
Admission: EM | Admit: 2012-05-27 | Discharge: 2012-05-27 | Disposition: A | Discharge status: Home / Self Care | Payer: Self-pay | Attending: Emergency Medicine | Admitting: Emergency Medicine

## 2012-05-27 ENCOUNTER — Emergency Department (HOSPITAL_COMMUNITY): Payer: Self-pay

## 2012-05-27 ENCOUNTER — Encounter (HOSPITAL_COMMUNITY): Payer: Self-pay | Admitting: *Deleted

## 2012-05-27 DIAGNOSIS — Y92009 Unspecified place in unspecified non-institutional (private) residence as the place of occurrence of the external cause: Secondary | ICD-10-CM | POA: Insufficient documentation

## 2012-05-27 DIAGNOSIS — W010XXA Fall on same level from slipping, tripping and stumbling without subsequent striking against object, initial encounter: Secondary | ICD-10-CM | POA: Insufficient documentation

## 2012-05-27 DIAGNOSIS — S93402A Sprain of unspecified ligament of left ankle, initial encounter: Secondary | ICD-10-CM

## 2012-05-27 DIAGNOSIS — Z8781 Personal history of (healed) traumatic fracture: Secondary | ICD-10-CM | POA: Insufficient documentation

## 2012-05-27 DIAGNOSIS — J45909 Unspecified asthma, uncomplicated: Secondary | ICD-10-CM | POA: Insufficient documentation

## 2012-05-27 DIAGNOSIS — F17200 Nicotine dependence, unspecified, uncomplicated: Secondary | ICD-10-CM | POA: Insufficient documentation

## 2012-05-27 DIAGNOSIS — Z862 Personal history of diseases of the blood and blood-forming organs and certain disorders involving the immune mechanism: Secondary | ICD-10-CM | POA: Insufficient documentation

## 2012-05-27 DIAGNOSIS — Z8639 Personal history of other endocrine, nutritional and metabolic disease: Secondary | ICD-10-CM | POA: Insufficient documentation

## 2012-05-27 DIAGNOSIS — S93409A Sprain of unspecified ligament of unspecified ankle, initial encounter: Secondary | ICD-10-CM | POA: Insufficient documentation

## 2012-05-27 DIAGNOSIS — Z8669 Personal history of other diseases of the nervous system and sense organs: Secondary | ICD-10-CM | POA: Insufficient documentation

## 2012-05-27 DIAGNOSIS — Y939 Activity, unspecified: Secondary | ICD-10-CM | POA: Insufficient documentation

## 2012-05-27 MED ORDER — OXYCODONE-ACETAMINOPHEN 5-325 MG PO TABS
1.0000 | ORAL_TABLET | Freq: Once | ORAL | Status: AC
  Administered 2012-05-27: 1 via ORAL
  Filled 2012-05-27: qty 1

## 2012-05-27 NOTE — ED Notes (Signed)
nad noted prior to dc. Dc instructions reviewed with pt and voiced understanding.  

## 2012-05-27 NOTE — ED Provider Notes (Signed)
History     CSN: [id number]  Arrival date & time 05/27/12  1317   First MD Initiated Contact with Patient 05/27/12 1344      Chief Complaint  Patient presents with  . Ankle Pain    Patient is a 37 y.o. male presenting with ankle pain. The history is provided by the patient.  Ankle Pain Location:  Ankle Time since incident:  2 days Ankle location:  L ankle Pain details:    Quality:  Aching   Radiates to: left calf.   Severity:  Mild   Onset quality:  Gradual   Duration:  2 days   Timing:  Constant   Progression:  Worsening Dislocation: no   Relieved by:  Rest Worsened by:  Activity Associated symptoms: no fever     Past Medical History  Diagnosis Date  . Asthma   . Thyroid disease   . Seizures     Past Surgical History  Procedure Laterality Date  . Abdominal surgery      History reviewed. No pertinent family history.  History  Substance Use Topics  . Smoking status: Current Every Day Smoker -- 0.50 packs/day for 15 years    Types: Cigarettes    Last Attempt to Quit: 08/19/2011  . Smokeless tobacco: Never Used  . Alcohol Use: No     Comment: denies      Review of Systems  Constitutional: Negative for fever.  Musculoskeletal: Positive for arthralgias.    Allergies  Other and Naproxen  Home Medications   Current Outpatient Rx  Name  Route  Sig  Dispense  Refill  . Benzocaine (BOIL-EASE EX)   Apply externally   Apply 1 application topically as needed. To help with boil.         . mupirocin cream (BACTROBAN) 2 %   Topical   Apply topically 2 (two) times daily. Apply with q tip to nostrils twice daily for 10 days   15 g   0   . sulfamethoxazole-trimethoprim (SEPTRA DS) 800-160 MG per tablet   Oral   Take 1 tablet by mouth every 12 (twelve) hours.   28 tablet   0     BP 116/88  Pulse 77  Temp(Src) 97 F (36.1 C) (Oral)  Resp 18  Ht 5\' 9"  (1.753 m)  Wt 120 lb (54.432 kg)  BMI 17.71 kg/m2  SpO2 100%  Physical  Exam CONSTITUTIONAL: Well developed/well nourished HEAD: Normocephalic/atraumatic EYES: EOMI/PERRL ENMT: Mucous membranes moist NECK: supple no meningeal signs CV: S1/S2 noted, no murmurs/rubs/gallops noted LUNGS: Lungs are clear to auscultation bilaterally, no apparent distress ABDOMEN: soft, nontender, no rebound or guarding NEURO: Pt is awake/alert, moves all extremitiesx4 EXTREMITIES: pulses normal, full ROM Mild tenderness to left lateral malleolus.  No deformity or swelling noted.  No laceration.  Mild tenderness to left midfoot.  No foot deformity.  Negative for left prox fib tenderness SKIN: warm, color normal PSYCH: no abnormalities of mood noted  ED Course  Procedures  Pt with no swelling/brusing on left ankle.  He has chronic fx to left distal fib.  No new fx.  Foot film reviewed by myself and radiology and negative.  Advised to go back to using his crutches and f/u with dr Hilda Lias   Labs Reviewed - No data to display Dg Ankle Complete Left  05/27/2012  *RADIOLOGY REPORT*  Clinical Data: 37 year old male with blunt trauma.  Ankle pain radiating to the leg.  History of ankle fracture 6 months ago.  LEFT  ANKLE COMPLETE - 3+ VIEW  Comparison: 01/26/2012 and earlier.  Findings: Healed/sclerotic distal left fibula fracture re- identified.  Mortise joint alignment preserved.  No joint effusion is evident.  Talar dome intact.  The tibia and calcaneus appear intact.  No new fracture identified.  IMPRESSION: No acute fracture or dislocation identified about the left ankle. Chronic left distal fibula fracture.   Original Report Authenticated By: Erskine Speed, M.D.      1. Left ankle sprain, initial encounter       MDM  Nursing notes including past medical history and social history reviewed and considered in documentation xrays reviewed and considered         Joya Gaskins, MD 05/27/12 1446

## 2012-05-27 NOTE — ED Notes (Signed)
Pain lt foot and ankle, Pt has had previous injury to this ankle.  Says 2 days ago that he "stepped wrong in the yard".

## 2012-07-23 ENCOUNTER — Encounter (HOSPITAL_COMMUNITY): Payer: Self-pay

## 2012-07-23 ENCOUNTER — Emergency Department (HOSPITAL_COMMUNITY)
Admission: EM | Admit: 2012-07-23 | Discharge: 2012-07-23 | Disposition: A | Discharge status: Home / Self Care | Payer: Self-pay | Attending: Emergency Medicine | Admitting: Emergency Medicine

## 2012-07-23 DIAGNOSIS — Z8669 Personal history of other diseases of the nervous system and sense organs: Secondary | ICD-10-CM | POA: Insufficient documentation

## 2012-07-23 DIAGNOSIS — Z862 Personal history of diseases of the blood and blood-forming organs and certain disorders involving the immune mechanism: Secondary | ICD-10-CM | POA: Insufficient documentation

## 2012-07-23 DIAGNOSIS — J45909 Unspecified asthma, uncomplicated: Secondary | ICD-10-CM | POA: Insufficient documentation

## 2012-07-23 DIAGNOSIS — K0889 Other specified disorders of teeth and supporting structures: Secondary | ICD-10-CM

## 2012-07-23 DIAGNOSIS — F17200 Nicotine dependence, unspecified, uncomplicated: Secondary | ICD-10-CM | POA: Insufficient documentation

## 2012-07-23 DIAGNOSIS — K089 Disorder of teeth and supporting structures, unspecified: Secondary | ICD-10-CM | POA: Insufficient documentation

## 2012-07-23 DIAGNOSIS — Z8639 Personal history of other endocrine, nutritional and metabolic disease: Secondary | ICD-10-CM | POA: Insufficient documentation

## 2012-07-23 MED ORDER — AMOXICILLIN 500 MG PO CAPS: 500.0000 mg | ORAL_CAPSULE | Freq: Three times a day (TID) | ORAL | Status: DC

## 2012-07-23 MED ORDER — ACETAMINOPHEN-CODEINE #3 300-30 MG PO TABS: 1.0000 | ORAL_TABLET | Freq: Four times a day (QID) | ORAL | Status: DC | PRN

## 2012-07-23 MED ORDER — ACETAMINOPHEN-CODEINE #3 300-30 MG PO TABS
1.0000 | ORAL_TABLET | Freq: Once | ORAL | Status: AC
  Administered 2012-07-23: 1 via ORAL
  Filled 2012-07-23: qty 1

## 2012-07-23 MED ORDER — PENICILLIN V POTASSIUM 250 MG PO TABS
500.0000 mg | ORAL_TABLET | Freq: Once | ORAL | Status: AC
  Administered 2012-07-23: 500 mg via ORAL
  Filled 2012-07-23: qty 2

## 2012-07-23 MED ORDER — ONDANSETRON HCL 4 MG PO TABS
4.0000 mg | ORAL_TABLET | Freq: Once | ORAL | Status: AC
  Administered 2012-07-23: 4 mg via ORAL
  Filled 2012-07-23: qty 1

## 2012-07-23 NOTE — ED Notes (Signed)
Pain rt mandibular  Molar, has been hurting for 3-4 days, tooth broke today and pain is worse.

## 2012-07-23 NOTE — ED Notes (Signed)
I had a tooth break off and I called the dentist and I can't see him until next week. They said to come here and get antibiotics per pt.

## 2012-07-23 NOTE — ED Provider Notes (Signed)
History     CSN: [id number]  Arrival date & time 07/23/12  2035   First MD Initiated Contact with Patient 07/23/12 2119      Chief Complaint  Patient presents with  . Dental Pain    (Consider location/radiation/quality/duration/timing/severity/associated sxs/prior treatment) Patient is a 37 y.o. male presenting with tooth pain. The history is provided by the patient.  Dental Pain Location:  Lower Lower teeth location:  19/LL 1st molar Quality:  Aching and throbbing Severity:  Moderate Onset quality:  Gradual Timing:  Constant Progression:  Worsening Chronicity:  Recurrent Context: poor dentition   Context: not abscess   Relieved by:  Nothing Worsened by:  Nothing tried Ineffective treatments:  None tried Associated symptoms: gum swelling   Associated symptoms: no difficulty swallowing   Risk factors: smoking     Past Medical History  Diagnosis Date  . Asthma   . Thyroid disease   . Seizures     Past Surgical History  Procedure Laterality Date  . Abdominal surgery      No family history on file.  History  Substance Use Topics  . Smoking status: Current Every Day Smoker -- 0.50 packs/day for 15 years    Types: Cigarettes    Last Attempt to Quit: 08/19/2011  . Smokeless tobacco: Never Used  . Alcohol Use: No     Comment: denies      Review of Systems  Allergies  Other and Naproxen  Home Medications   Current Outpatient Rx  Name  Route  Sig  Dispense  Refill  . benzocaine (ORAJEL) 10 % mucosal gel   Mouth/Throat   Use as directed 1 application in the mouth or throat as needed for pain.         Marland Kitchen ibuprofen (ADVIL,MOTRIN) 200 MG tablet   Oral   Take 400 mg by mouth every 6 (six) hours as needed for pain.           BP 126/74  Pulse 72  Temp(Src) 97.3 F (36.3 C) (Oral)  Resp 18  Ht 5\' 9"  (1.753 m)  Wt 125 lb (56.7 kg)  BMI 18.45 kg/m2  SpO2 96%  Physical Exam  Nursing note and vitals reviewed. Constitutional: He is oriented to  person, place, and time. He appears well-developed and well-nourished.  Non-toxic appearance.  HENT:  Head: Normocephalic.  Right Ear: Tympanic membrane and external ear normal.  Left Ear: Tympanic membrane and external ear normal.  Mouth/Throat:    Eyes: EOM and lids are normal. Pupils are equal, round, and reactive to light.  Neck: Normal range of motion. Neck supple. Carotid bruit is not present.  Cardiovascular: Normal rate, regular rhythm, normal heart sounds, intact distal pulses and normal pulses.   Pulmonary/Chest: Breath sounds normal. No respiratory distress.  Abdominal: Soft. Bowel sounds are normal. There is no tenderness. There is no guarding.  Musculoskeletal: Normal range of motion.  Lymphadenopathy:       Head (right side): No submandibular adenopathy present.       Head (left side): No submandibular adenopathy present.    He has no cervical adenopathy.  Neurological: He is alert and oriented to person, place, and time. He has normal strength. No cranial nerve deficit or sensory deficit.  Skin: Skin is warm and dry.  Psychiatric: He has a normal mood and affect. His speech is normal.    ED Course  Procedures (including critical care time)  Labs Reviewed - No data to display No results found.  No diagnosis found.    MDM  I have reviewed nursing notes, vital signs, and all appropriate lab and imaging results for this patient. Pt to use Amoxil 3 times daily, and Tylenol with Codeine every 4-6 hours as needed for pain. Patient is to see the dentist as sone as possible. No no abscess appreciated. Airway is patent. There is no signs of Ludwig's Angina.       Kathie Dike, PA-C 07/23/12 2230

## 2012-07-30 NOTE — ED Provider Notes (Signed)
Medical screening examination/treatment/procedure(s) were performed by non-physician practitioner and as supervising physician I was immediately available for consultation/collaboration.  Raeford Razor, MD 07/30/12 [phone]

## 2012-08-03 ENCOUNTER — Emergency Department (HOSPITAL_COMMUNITY)
Admission: EM | Admit: 2012-08-03 | Discharge: 2012-08-03 | Disposition: A | Discharge status: Home / Self Care | Payer: Self-pay | Attending: Emergency Medicine | Admitting: Emergency Medicine

## 2012-08-03 ENCOUNTER — Encounter (HOSPITAL_COMMUNITY): Payer: Self-pay | Admitting: *Deleted

## 2012-08-03 ENCOUNTER — Other Ambulatory Visit: Payer: Self-pay

## 2012-08-03 DIAGNOSIS — Z87828 Personal history of other (healed) physical injury and trauma: Secondary | ICD-10-CM | POA: Insufficient documentation

## 2012-08-03 DIAGNOSIS — Z862 Personal history of diseases of the blood and blood-forming organs and certain disorders involving the immune mechanism: Secondary | ICD-10-CM | POA: Insufficient documentation

## 2012-08-03 DIAGNOSIS — R259 Unspecified abnormal involuntary movements: Secondary | ICD-10-CM | POA: Insufficient documentation

## 2012-08-03 DIAGNOSIS — Z8639 Personal history of other endocrine, nutritional and metabolic disease: Secondary | ICD-10-CM | POA: Insufficient documentation

## 2012-08-03 DIAGNOSIS — G40909 Epilepsy, unspecified, not intractable, without status epilepticus: Secondary | ICD-10-CM | POA: Insufficient documentation

## 2012-08-03 DIAGNOSIS — Z760 Encounter for issue of repeat prescription: Secondary | ICD-10-CM | POA: Insufficient documentation

## 2012-08-03 DIAGNOSIS — F410 Panic disorder [episodic paroxysmal anxiety] without agoraphobia: Secondary | ICD-10-CM | POA: Insufficient documentation

## 2012-08-03 DIAGNOSIS — J45901 Unspecified asthma with (acute) exacerbation: Secondary | ICD-10-CM | POA: Insufficient documentation

## 2012-08-03 DIAGNOSIS — F17200 Nicotine dependence, unspecified, uncomplicated: Secondary | ICD-10-CM | POA: Insufficient documentation

## 2012-08-03 MED ORDER — ALBUTEROL SULFATE HFA 108 (90 BASE) MCG/ACT IN AERS
2.0000 | INHALATION_SPRAY | RESPIRATORY_TRACT | Status: DC | PRN
  Administered 2012-08-03: 2 via RESPIRATORY_TRACT
  Filled 2012-08-03: qty 6.7

## 2012-08-03 NOTE — ED Provider Notes (Signed)
History    This chart was scribed for Lyanne Co, MD, by Frederik Pear, ED scribe. The patient was seen in room APA06/AP[facility]06 and the patient's care was started at 0703.    CSN: [id number]  Arrival date & time 08/03/12  4098   First MD Initiated Contact with Patient 08/03/12 0703      Chief Complaint  Patient presents with  . Panic Attack  . Shortness of Breath    (Consider location/radiation/quality/duration/timing/severity/associated sxs/prior treatment) The history is provided by the patient and medical records. No language interpreter was used.    HPI Comments: Derrick Lyons is a 37 y.o. male with a h/o of asthma and seizures who presents to the Emergency Department complaining of gradually improving SOB with associated tremors that began at 0535 at home. He reports that he his vision began to go in and out, which has been an indicator of his two previous seizures, which caused him to become anxious. He was seen in the ED on 12/22/11 after his first seizure that lasted for several minutes with tonic clonic occurred after he suffered a head contusion. He was discharged with Keppra, but did not fill the medication due to not having insurance coverage. He attempted to alleviate his symptoms by sitting down and relaxing with no relief. In the ED, he reports his symptoms have resolved. He states that he normally has an albuterol inhaler, but is currently out.   No PCP or Neurologist.   Past Medical History  Diagnosis Date  . Asthma   . Thyroid disease   . Seizures     Past Surgical History  Procedure Laterality Date  . Abdominal surgery      No family history on file.  History  Substance Use Topics  . Smoking status: Current Every Day Smoker -- 0.50 packs/day for 15 years    Types: Cigarettes    Last Attempt to Quit: 08/19/2011  . Smokeless tobacco: Never Used  . Alcohol Use: No     Comment: denies      Review of Systems A complete 10 system review of systems was  obtained and all systems are negative except as noted in the HPI and PMH.  Allergies  Other and Naproxen  Home Medications   Current Outpatient Rx  Name  Route  Sig  Dispense  Refill  . acetaminophen-codeine (TYLENOL #3) 300-30 MG per tablet   Oral   Take 1-2 tablets by mouth every 6 (six) hours as needed for pain.   20 tablet   0   . amoxicillin (AMOXIL) 500 MG capsule   Oral   Take 1 capsule (500 mg total) by mouth 3 (three) times daily.   21 capsule   0   . benzocaine (ORAJEL) 10 % mucosal gel   Mouth/Throat   Use as directed 1 application in the mouth or throat as needed for pain.         Marland Kitchen ibuprofen (ADVIL,MOTRIN) 200 MG tablet   Oral   Take 400 mg by mouth every 6 (six) hours as needed for pain.           BP 127/86  Pulse 52  Temp(Src) 97.6 F (36.4 C) (Oral)  Resp 24  Ht 5\' 9"  (1.753 m)  Wt 125 lb (56.7 kg)  BMI 18.45 kg/m2  SpO2 100%  Physical Exam  Nursing note and vitals reviewed. Constitutional: He is oriented to person, place, and time. He appears well-developed and well-nourished.  HENT:  Head: Normocephalic  and atraumatic.  Eyes: EOM are normal.  Neck: Normal range of motion.  Cardiovascular: Normal rate, regular rhythm, normal heart sounds and intact distal pulses.   Pulmonary/Chest: Effort normal and breath sounds normal. No respiratory distress. He has no wheezes. He has no rales. He exhibits no tenderness.  Abdominal: Soft. He exhibits no distension. There is no tenderness.  Genitourinary: Rectum normal.  Musculoskeletal: Normal range of motion.  Neurological: He is alert and oriented to person, place, and time.  Skin: Skin is warm and dry.  Psychiatric: He has a normal mood and affect. Judgment normal.    ED Course  Procedures (including critical care time)   Date: 08/03/2012  Rate: 50  Rhythm: sinus bradycardia  QRS Axis: normal  Intervals: normal  ST/T Wave abnormalities: normal  Conduction Disutrbances: none  Narrative  Interpretation:   Old EKG Reviewed: No significant changes noted  DIAGNOSTIC STUDIES: Oxygen Saturation is 96% on room air, normal by my interpretation.    COORDINATION OF CARE:  07:30- Discussed planned course of treatment with the patient, including albuterol and following up with a neurologist, who is agreeable at this time.  07:38- Medication Orders- albuterol (proventil HFA; Ventolin HFA) 108 (90 Base) MCG/ACT inhaler 2 puff, every four hours.   Labs Reviewed - No data to display No results found.   1. Panic attack   2. Medication refill     MDM  Patient is overall well-appearing.  He feels much better.  EKG is normal sinus rhythm.  Doubt ACS.  Doubt PE.  Discharge home in good condition.  Albuterol inhaler prescribed the patient.  I personally performed the services described in this documentation, which was scribed in my presence. The recorded information has been reviewed and is accurate.          Lyanne Co, MD 08/03/12 0830

## 2012-08-03 NOTE — ED Notes (Signed)
SOB began after feeling lightheaded this morning.  Has had 2 seizures in the past and stated he thought he was going to have another one, which precipitated him feeling anxious and SOB.  Has not been on any antiseizure meds d/t lack of funds.  Never followed up with neurologist d/t same.  Has had asthma since childhood, but does not have inhalers at home d/t funds.  Recent life changes of moving in with his mother whom he says "likes to fuss about a lot of things".  Lungs clear bilaterally, slightly tachypneic and tearful.  Provided 2L [place] for comfort.

## 2012-08-03 NOTE — ED Notes (Signed)
Started approx 2 hrs ago when he felt like he was going to have a seizure.  States started feeling like he "couldn't breath good."  Speaking clear full sentences in triage.

## 2012-11-04 ENCOUNTER — Emergency Department (HOSPITAL_COMMUNITY)
Admission: EM | Admit: 2012-11-04 | Discharge: 2012-11-04 | Disposition: A | Discharge status: Home / Self Care | Payer: Self-pay | Attending: Emergency Medicine | Admitting: Emergency Medicine

## 2012-11-04 ENCOUNTER — Emergency Department (HOSPITAL_COMMUNITY): Payer: Self-pay

## 2012-11-04 ENCOUNTER — Encounter (HOSPITAL_COMMUNITY): Payer: Self-pay | Admitting: *Deleted

## 2012-11-04 DIAGNOSIS — R0789 Other chest pain: Secondary | ICD-10-CM

## 2012-11-04 DIAGNOSIS — J45901 Unspecified asthma with (acute) exacerbation: Secondary | ICD-10-CM | POA: Insufficient documentation

## 2012-11-04 DIAGNOSIS — F17200 Nicotine dependence, unspecified, uncomplicated: Secondary | ICD-10-CM | POA: Insufficient documentation

## 2012-11-04 DIAGNOSIS — Z8639 Personal history of other endocrine, nutritional and metabolic disease: Secondary | ICD-10-CM | POA: Insufficient documentation

## 2012-11-04 DIAGNOSIS — Z792 Long term (current) use of antibiotics: Secondary | ICD-10-CM | POA: Insufficient documentation

## 2012-11-04 DIAGNOSIS — R071 Chest pain on breathing: Secondary | ICD-10-CM | POA: Insufficient documentation

## 2012-11-04 DIAGNOSIS — R569 Unspecified convulsions: Secondary | ICD-10-CM | POA: Insufficient documentation

## 2012-11-04 DIAGNOSIS — J40 Bronchitis, not specified as acute or chronic: Secondary | ICD-10-CM

## 2012-11-04 DIAGNOSIS — Z862 Personal history of diseases of the blood and blood-forming organs and certain disorders involving the immune mechanism: Secondary | ICD-10-CM | POA: Insufficient documentation

## 2012-11-04 MED ORDER — PREDNISONE 50 MG PO TABS
60.0000 mg | ORAL_TABLET | Freq: Once | ORAL | Status: AC
  Administered 2012-11-04: 60 mg via ORAL
  Filled 2012-11-04: qty 1

## 2012-11-04 MED ORDER — HYDROCODONE-ACETAMINOPHEN 5-325 MG PO TABS
2.0000 | ORAL_TABLET | Freq: Once | ORAL | Status: AC
  Administered 2012-11-04: 2 via ORAL
  Filled 2012-11-04: qty 2

## 2012-11-04 MED ORDER — HYDROCODONE-ACETAMINOPHEN 5-325 MG PO TABS: 1.0000 | ORAL_TABLET | ORAL | Status: DC | PRN

## 2012-11-04 MED ORDER — ONDANSETRON HCL 4 MG PO TABS
4.0000 mg | ORAL_TABLET | Freq: Once | ORAL | Status: AC
  Administered 2012-11-04: 4 mg via ORAL
  Filled 2012-11-04: qty 1

## 2012-11-04 MED ORDER — PREDNISONE 10 MG PO TABS: ORAL_TABLET | ORAL | Status: DC

## 2012-11-04 NOTE — ED Notes (Signed)
Pain rt antero lat chest, sharp, Increased pain with deep breath or movement.  Cough for 3 mos.

## 2012-11-04 NOTE — ED Provider Notes (Signed)
Medical screening examination/treatment/procedure(s) were performed by non-physician practitioner and as supervising physician I was immediately available for consultation/collaboration.   Shelda Jakes, MD 11/04/12 [phone]

## 2012-11-04 NOTE — ED Notes (Signed)
H. Bryant, PA at bedside. 

## 2012-11-04 NOTE — ED Provider Notes (Signed)
CSN: [id number]     Arrival date & time 11/04/12  1131 History   First MD Initiated Contact with Patient 11/04/12 1220     Chief Complaint  Patient presents with  . Chest Pain   (Consider location/radiation/quality/duration/timing/severity/associated sxs/prior Treatment) HPI Comments: Pt reports cough for about 3 months, worse for the past 4 days. No hemoptosis. No reported high fever. Minimal improvement in the pain from ibuprofen.  Patient is a 37 y.o. male presenting with chest pain. The history is provided by the patient.  Chest Pain Pain location:  R lateral chest Pain quality: sharp and stabbing   Pain radiates to:  Does not radiate Pain radiates to the back: no   Pain severity:  Severe Onset quality:  Gradual Duration:  3 weeks Timing:  Intermittent Progression:  Worsening Chronicity:  New Context: breathing and movement   Relieved by:  Nothing Worsened by:  Coughing, deep breathing and movement Ineffective treatments:  None tried Associated symptoms: no abdominal pain, no altered mental status, no back pain, no cough, no dizziness, no fever, no lower extremity edema, no palpitations and no shortness of breath   Risk factors: smoking     Past Medical History  Diagnosis Date  . Asthma   . Thyroid disease   . Seizures    Past Surgical History  Procedure Laterality Date  . Abdominal surgery     History reviewed. No pertinent family history. History  Substance Use Topics  . Smoking status: Current Every Day Smoker -- 0.50 packs/day for 15 years    Types: Cigarettes    Last Attempt to Quit: 08/19/2011  . Smokeless tobacco: Never Used  . Alcohol Use: Yes     Comment: beer at times    Review of Systems  Constitutional: Negative for fever and activity change.       All ROS Neg except as noted in HPI  HENT: Negative for nosebleeds and neck pain.   Eyes: Negative for photophobia and discharge.  Respiratory: Positive for wheezing. Negative for cough and shortness  of breath.   Cardiovascular: Positive for chest pain. Negative for palpitations.  Gastrointestinal: Negative for abdominal pain and blood in stool.  Genitourinary: Negative for dysuria, frequency and hematuria.  Musculoskeletal: Negative for back pain and arthralgias.  Skin: Negative.   Neurological: Positive for seizures. Negative for dizziness and speech difficulty.  Psychiatric/Behavioral: Negative for hallucinations and confusion.    Allergies  Other and Naproxen  Home Medications   Current Outpatient Rx  Name  Route  Sig  Dispense  Refill  . acetaminophen-codeine (TYLENOL #3) 300-30 MG per tablet   Oral   Take 1-2 tablets by mouth every 6 (six) hours as needed for pain.   20 tablet   0   . amoxicillin (AMOXIL) 500 MG capsule   Oral   Take 1 capsule (500 mg total) by mouth 3 (three) times daily.   21 capsule   0   . benzocaine (ORAJEL) 10 % mucosal gel   Mouth/Throat   Use as directed 1 application in the mouth or throat as needed for pain.         Marland Kitchen ibuprofen (ADVIL,MOTRIN) 200 MG tablet   Oral   Take 400 mg by mouth every 6 (six) hours as needed for pain.          BP 111/76  Pulse 100  Temp(Src) 98.3 F (36.8 C) (Oral)  Resp 20  Ht 5\' 9"  (1.753 m)  Wt 120 lb (54.432 kg)  BMI 17.71 kg/m2  SpO2 93% Physical Exam  Nursing note and vitals reviewed. Constitutional: He is oriented to person, place, and time. He appears well-developed and well-nourished.  Non-toxic appearance.  HENT:  Head: Normocephalic.  Right Ear: Tympanic membrane and external ear normal.  Left Ear: Tympanic membrane and external ear normal.  Eyes: EOM and lids are normal. Pupils are equal, round, and reactive to light.  Neck: Normal range of motion. Neck supple. Carotid bruit is not present.  Cardiovascular: Normal rate, regular rhythm, normal heart sounds, intact distal pulses and normal pulses.   Pulmonary/Chest: No respiratory distress. He has wheezes.  Right lower lateral chest  wall pain. No crepitus. No palpable deformity.  Abdominal: Soft. Bowel sounds are normal. There is no tenderness. There is no guarding.  Musculoskeletal: Normal range of motion.  Lymphadenopathy:       Head (right side): No submandibular adenopathy present.       Head (left side): No submandibular adenopathy present.    He has no cervical adenopathy.  Neurological: He is alert and oriented to person, place, and time. He has normal strength. No cranial nerve deficit or sensory deficit.  Skin: Skin is warm and dry.  Psychiatric: He has a normal mood and affect. His speech is normal.    ED Course  Procedures (including critical care time) Labs Review Labs Reviewed - No data to display Imaging Review No results found.  MDM  No diagnosis found. **I have reviewed nursing notes, vital signs, and all appropriate lab and imaging results for this patient.  Chest xray is negative for pneumonia or rib fx.  No reported hemoptosis. No temp elevations. Pt reports increase cough and congestion.  He is an every day smoker. Pulse ox 93% at the lower limits of normal, but otherwise vital signs wnl. Pt will be treated for bronchitis and chest wall pain. Rx for Norco and prednisone taper given to the patient. Pt to return if not improving.  Kathie Dike, PA-C 11/04/12 1744  Kathie Dike, PA-C 11/04/12 1745

## 2012-11-13 ENCOUNTER — Emergency Department (HOSPITAL_COMMUNITY): Payer: Self-pay

## 2012-11-13 ENCOUNTER — Encounter (HOSPITAL_COMMUNITY): Payer: Self-pay

## 2012-11-13 ENCOUNTER — Emergency Department (HOSPITAL_COMMUNITY)
Admission: EM | Admit: 2012-11-13 | Discharge: 2012-11-13 | Disposition: A | Discharge status: Home / Self Care | Payer: Self-pay | Attending: Emergency Medicine | Admitting: Emergency Medicine

## 2012-11-13 DIAGNOSIS — F17200 Nicotine dependence, unspecified, uncomplicated: Secondary | ICD-10-CM | POA: Insufficient documentation

## 2012-11-13 DIAGNOSIS — Z8669 Personal history of other diseases of the nervous system and sense organs: Secondary | ICD-10-CM | POA: Insufficient documentation

## 2012-11-13 DIAGNOSIS — J45909 Unspecified asthma, uncomplicated: Secondary | ICD-10-CM | POA: Insufficient documentation

## 2012-11-13 DIAGNOSIS — R0789 Other chest pain: Secondary | ICD-10-CM

## 2012-11-13 DIAGNOSIS — Z862 Personal history of diseases of the blood and blood-forming organs and certain disorders involving the immune mechanism: Secondary | ICD-10-CM | POA: Insufficient documentation

## 2012-11-13 DIAGNOSIS — R071 Chest pain on breathing: Secondary | ICD-10-CM | POA: Insufficient documentation

## 2012-11-13 DIAGNOSIS — Z8639 Personal history of other endocrine, nutritional and metabolic disease: Secondary | ICD-10-CM | POA: Insufficient documentation

## 2012-11-13 HISTORY — DX: Other injury of unspecified body region, initial encounter: T14.8XXA

## 2012-11-13 MED ORDER — OXYCODONE-ACETAMINOPHEN 5-325 MG PO TABS
1.0000 | ORAL_TABLET | Freq: Once | ORAL | Status: AC
  Administered 2012-11-13: 1 via ORAL
  Filled 2012-11-13: qty 1

## 2012-11-13 MED ORDER — ACETAMINOPHEN 500 MG PO TABS: 500.0000 mg | ORAL_TABLET | Freq: Four times a day (QID) | ORAL | Status: DC | PRN

## 2012-11-13 NOTE — ED Notes (Addendum)
Pt reports right side chest wall/ rib pain for 2 weeks, was seen in the ed for same 9/22, "was given a small amount of steroids and pain meds" is out of both currently and stated he is no better. Denies any new or recent injury. Thinks he may have lifted to much. Hurts to take a deep breath.
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
## 2012-12-04 IMAGING — CR DG ANKLE COMPLETE 3+V*L*
3 series · 3 of 3 positions shown · non-contrast
Comparison: December 30, 2011

CLINICAL DATA: Fracture distal fibula

LEFT ANKLE COMPLETE - 3+ VIEW

[view not recorded (1 of 3)]
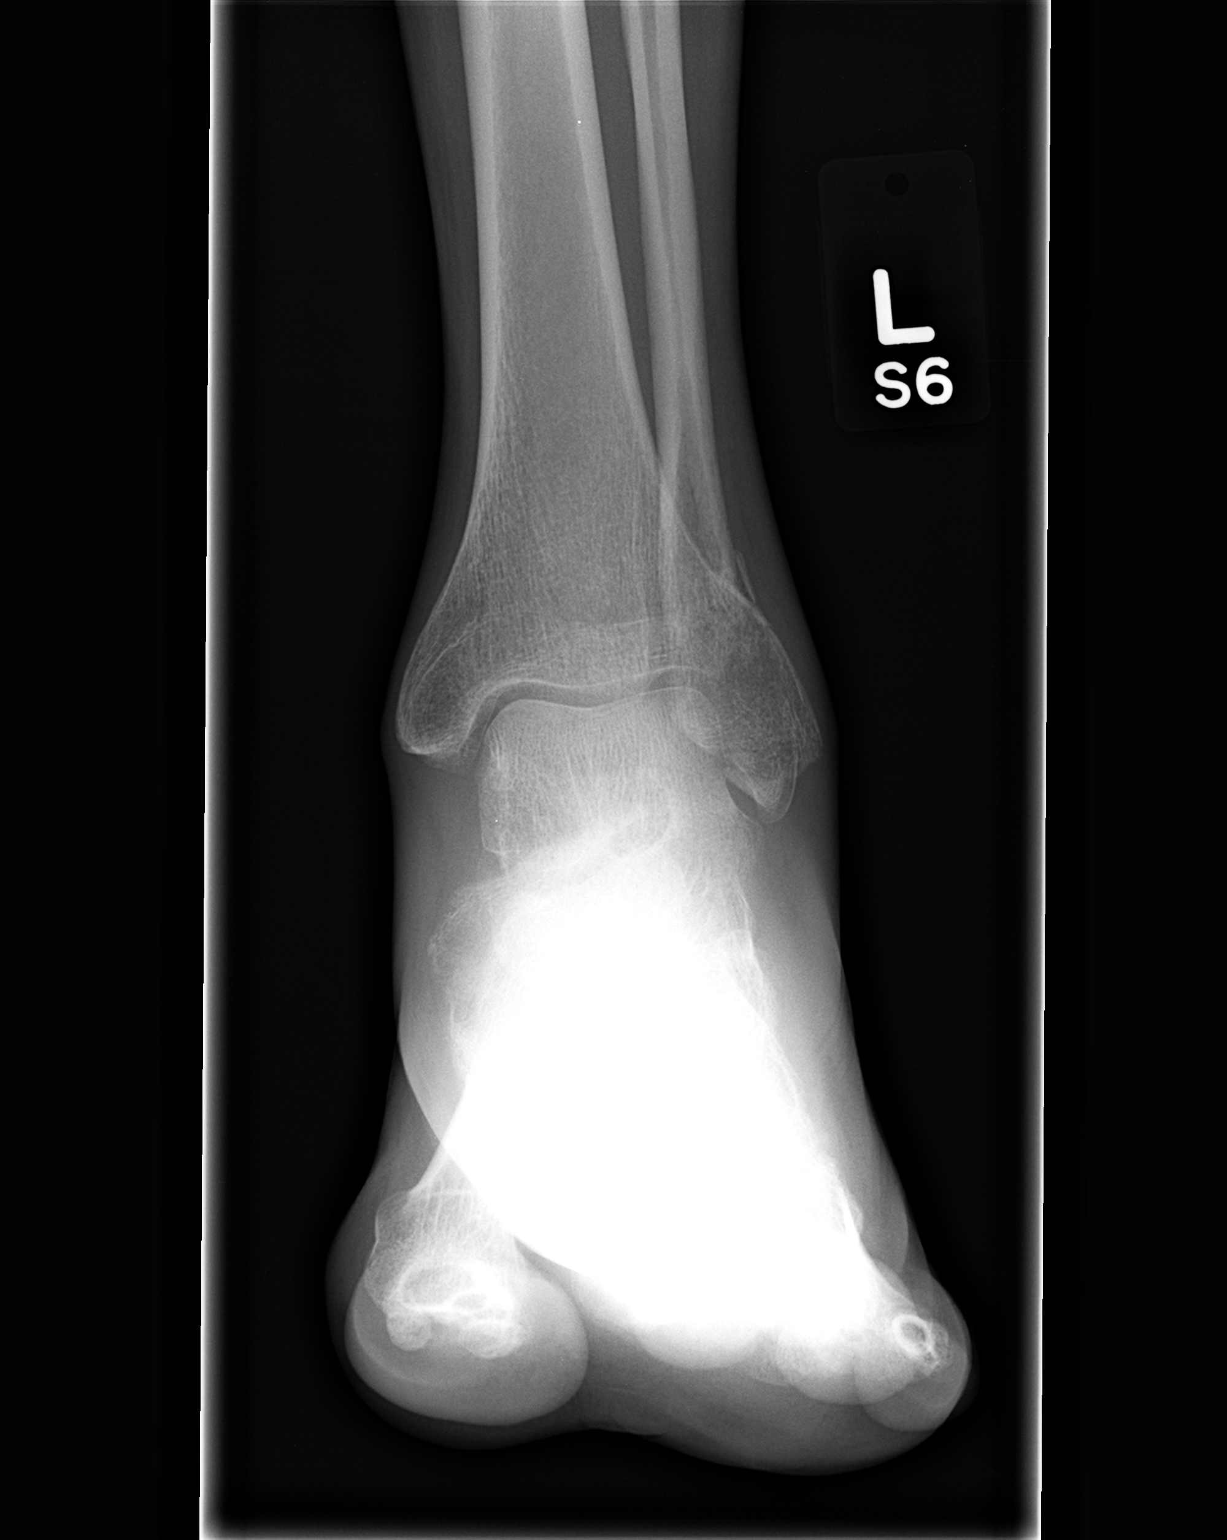

[view not recorded (2 of 3)]
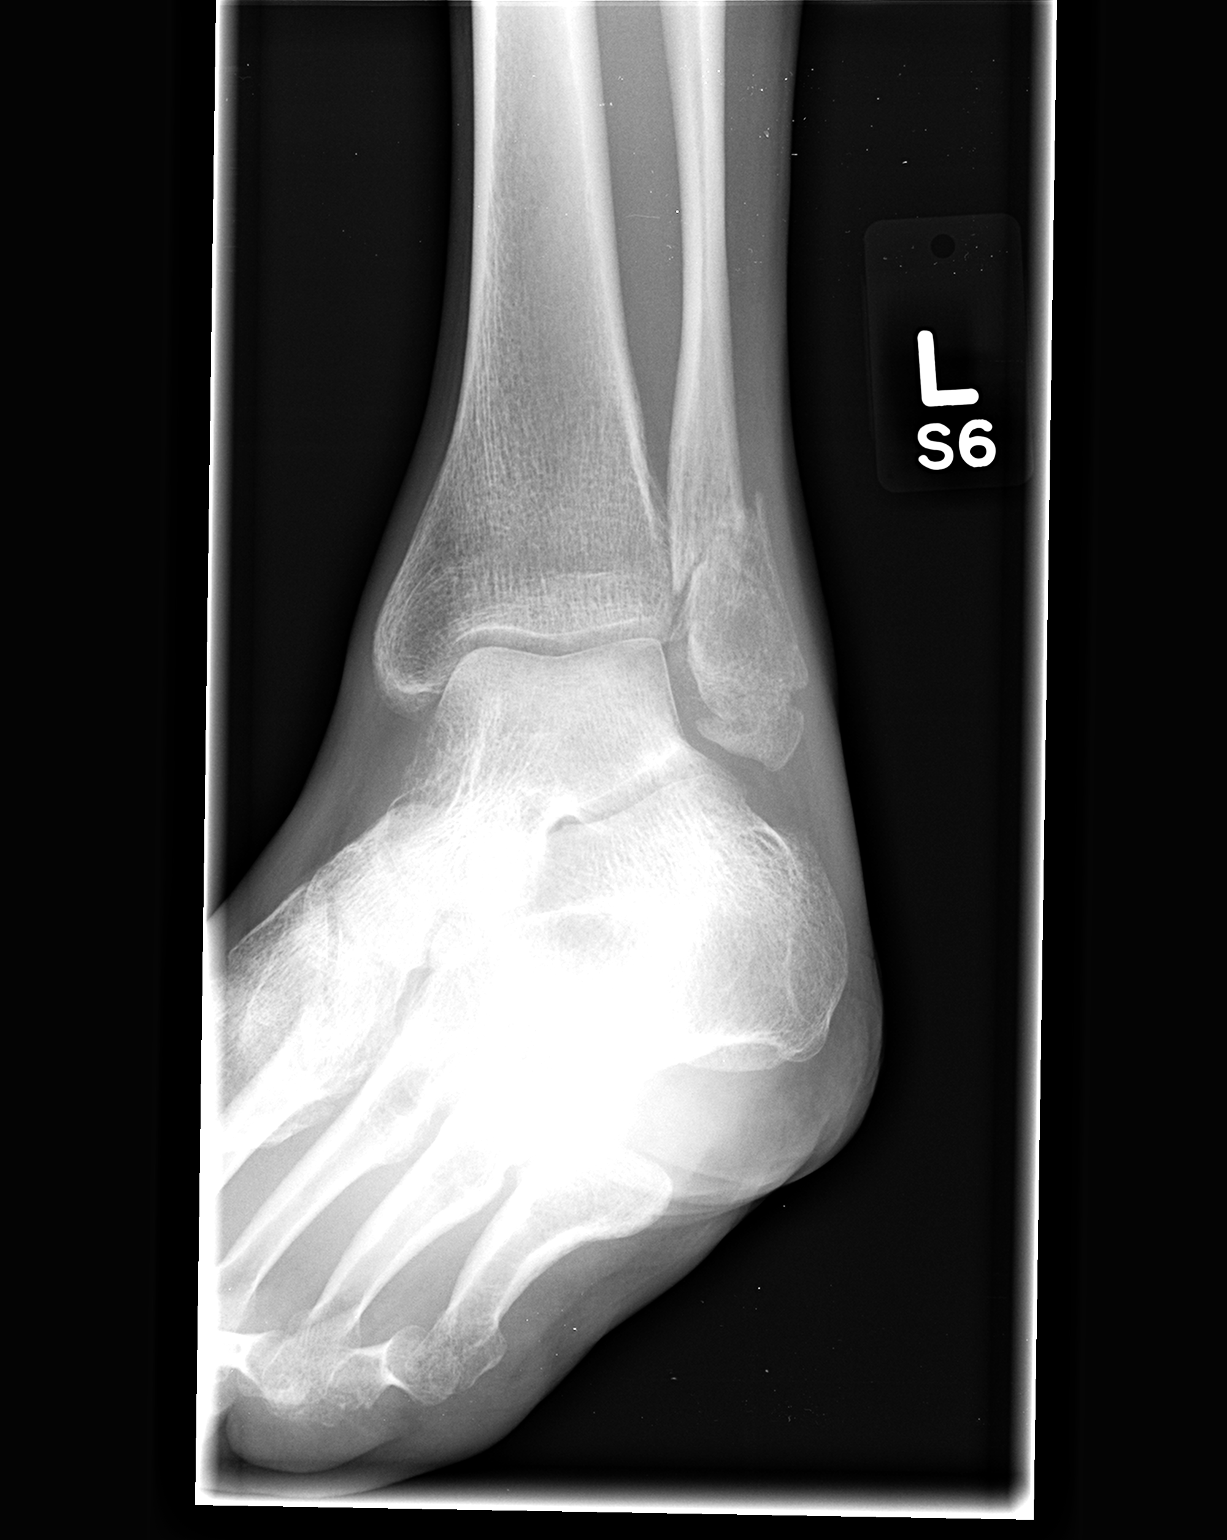

[view not recorded (3 of 3)]
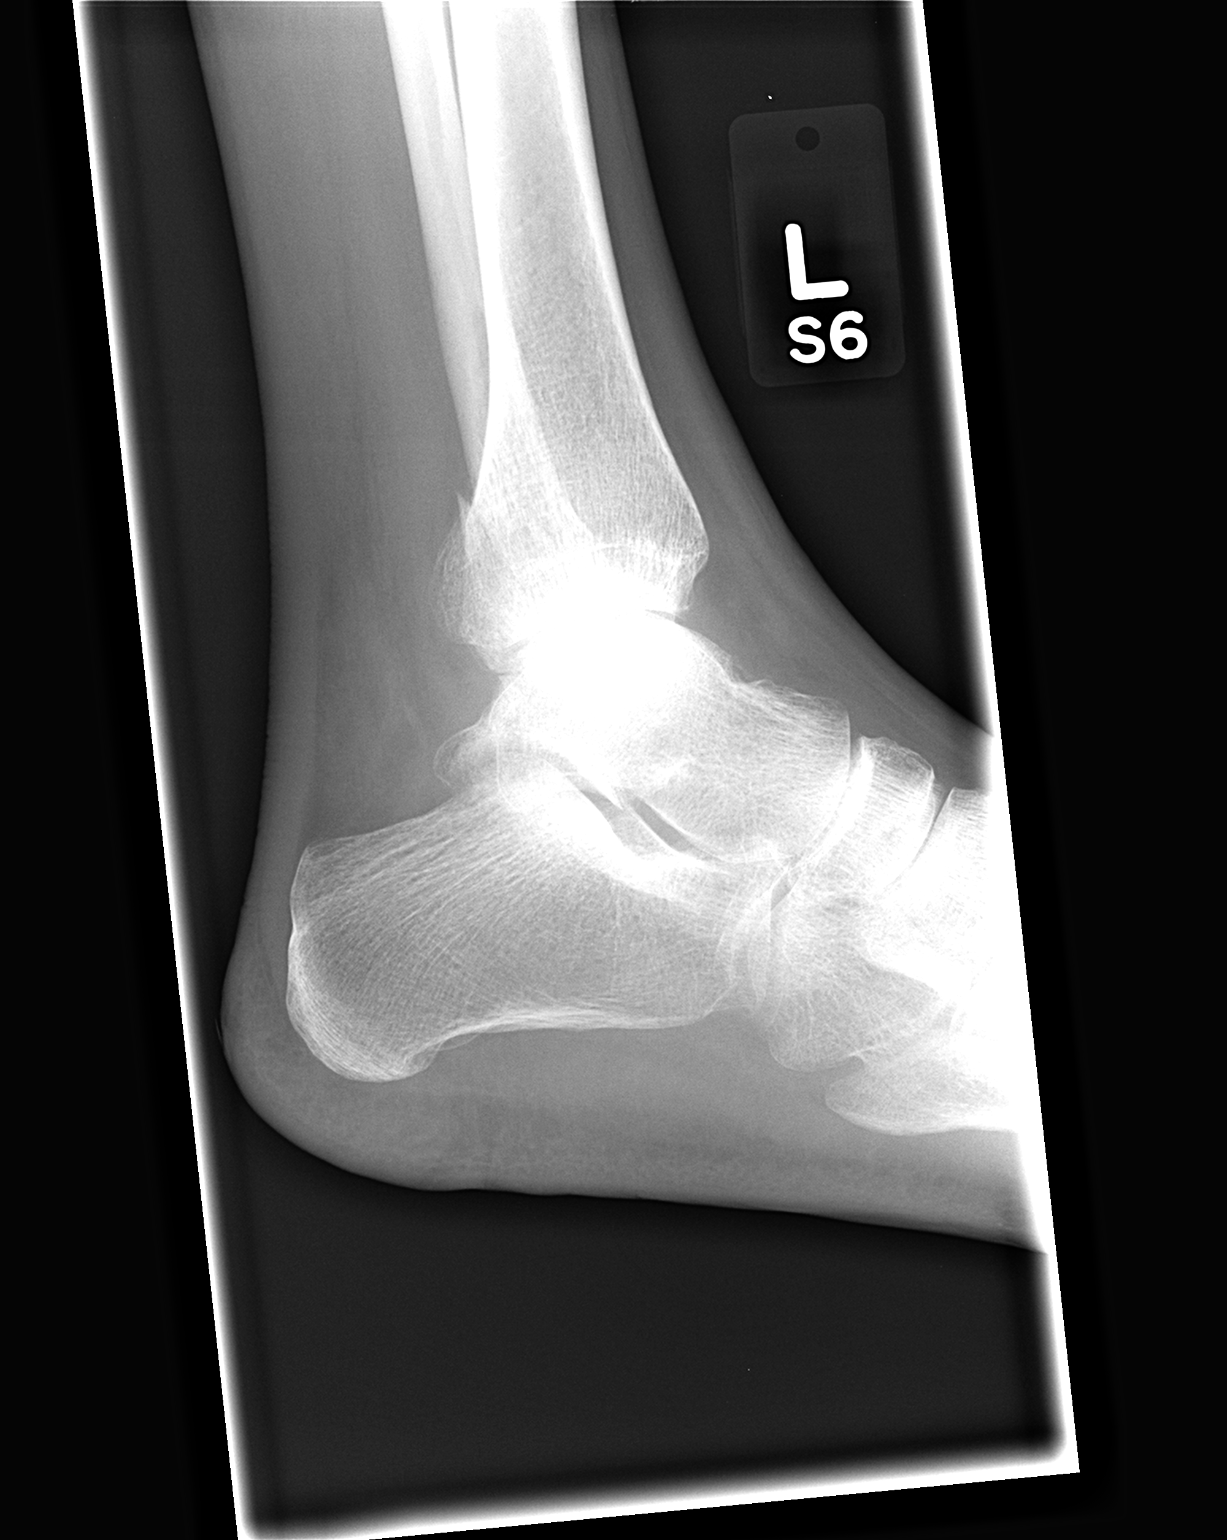

[3 of 3 positions shown; findings below may reference images not displayed]

FINDINGS: Frontal, oblique, and lateral views were obtained. There
is a comminuted obliquely oriented fracture of the distal fibular
diaphysis.  The metaphysis - diaphysis junction.  There is mild
lateral displacement of the distal major fracture fragment with
respect to proximal fragment.  There is no appreciable callus
formation.

There is no new fracture.  There is no appreciable joint effusion.
Ankle mortise appears intact.
IMPRESSION: Fracture of the distal fibula without appreciable healing.  Mild
displacement major fracture fragments.  Ankle mortise appears
grossly intact.

## 2013-08-31 ENCOUNTER — Emergency Department (HOSPITAL_COMMUNITY): Payer: Self-pay

## 2013-08-31 ENCOUNTER — Emergency Department (HOSPITAL_COMMUNITY)
Admission: EM | Admit: 2013-08-31 | Discharge: 2013-08-31 | Disposition: A | Discharge status: Home / Self Care | Payer: Self-pay | Attending: Emergency Medicine | Admitting: Emergency Medicine

## 2013-08-31 ENCOUNTER — Encounter (HOSPITAL_COMMUNITY): Payer: Self-pay | Admitting: Emergency Medicine

## 2013-08-31 DIAGNOSIS — S01511A Laceration without foreign body of lip, initial encounter: Secondary | ICD-10-CM

## 2013-08-31 DIAGNOSIS — S0003XA Contusion of scalp, initial encounter: Secondary | ICD-10-CM | POA: Insufficient documentation

## 2013-08-31 DIAGNOSIS — IMO0002 Reserved for concepts with insufficient information to code with codable children: Secondary | ICD-10-CM | POA: Insufficient documentation

## 2013-08-31 DIAGNOSIS — Z862 Personal history of diseases of the blood and blood-forming organs and certain disorders involving the immune mechanism: Secondary | ICD-10-CM | POA: Insufficient documentation

## 2013-08-31 DIAGNOSIS — Z8639 Personal history of other endocrine, nutritional and metabolic disease: Secondary | ICD-10-CM | POA: Insufficient documentation

## 2013-08-31 DIAGNOSIS — F17200 Nicotine dependence, unspecified, uncomplicated: Secondary | ICD-10-CM | POA: Insufficient documentation

## 2013-08-31 DIAGNOSIS — S01501A Unspecified open wound of lip, initial encounter: Secondary | ICD-10-CM | POA: Insufficient documentation

## 2013-08-31 DIAGNOSIS — J45909 Unspecified asthma, uncomplicated: Secondary | ICD-10-CM | POA: Insufficient documentation

## 2013-08-31 DIAGNOSIS — S1093XA Contusion of unspecified part of neck, initial encounter: Secondary | ICD-10-CM

## 2013-08-31 DIAGNOSIS — S0083XA Contusion of other part of head, initial encounter: Secondary | ICD-10-CM | POA: Insufficient documentation

## 2013-08-31 DIAGNOSIS — T07XXXA Unspecified multiple injuries, initial encounter: Secondary | ICD-10-CM

## 2013-08-31 MED ORDER — HYDROCODONE-ACETAMINOPHEN 5-325 MG PO TABS: 1.0000 | ORAL_TABLET | Freq: Four times a day (QID) | ORAL | Status: DC | PRN

## 2013-08-31 MED ORDER — HYDROCODONE-ACETAMINOPHEN 5-325 MG PO TABS: 1.0000 | ORAL_TABLET | Freq: Once | ORAL | Status: DC
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
## 2013-08-31 NOTE — Discharge Instructions (Signed)
CT scan of head neck and face without any significant abnormalities. Lip lacerations will heal on their own. Take pain medicine as needed. Expect to be very sore the next couple days. Return for any newer worse symptoms. Work note provided as needed.
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
## 2013-11-06 ENCOUNTER — Emergency Department (HOSPITAL_COMMUNITY): Payer: Self-pay

## 2013-11-06 ENCOUNTER — Encounter (HOSPITAL_COMMUNITY): Payer: Self-pay | Admitting: Emergency Medicine

## 2013-11-06 ENCOUNTER — Emergency Department (HOSPITAL_COMMUNITY)
Admission: EM | Admit: 2013-11-06 | Discharge: 2013-11-06 | Disposition: A | Discharge status: Home / Self Care | Payer: Self-pay | Attending: Emergency Medicine | Admitting: Emergency Medicine

## 2013-11-06 DIAGNOSIS — Z720 Tobacco use: Secondary | ICD-10-CM

## 2013-11-06 DIAGNOSIS — J40 Bronchitis, not specified as acute or chronic: Secondary | ICD-10-CM

## 2013-11-06 DIAGNOSIS — Z862 Personal history of diseases of the blood and blood-forming organs and certain disorders involving the immune mechanism: Secondary | ICD-10-CM | POA: Insufficient documentation

## 2013-11-06 DIAGNOSIS — Z8639 Personal history of other endocrine, nutritional and metabolic disease: Secondary | ICD-10-CM | POA: Insufficient documentation

## 2013-11-06 DIAGNOSIS — R059 Cough, unspecified: Secondary | ICD-10-CM | POA: Insufficient documentation

## 2013-11-06 DIAGNOSIS — R5381 Other malaise: Secondary | ICD-10-CM | POA: Insufficient documentation

## 2013-11-06 DIAGNOSIS — R1110 Vomiting, unspecified: Secondary | ICD-10-CM | POA: Insufficient documentation

## 2013-11-06 DIAGNOSIS — R5383 Other fatigue: Secondary | ICD-10-CM

## 2013-11-06 DIAGNOSIS — F17200 Nicotine dependence, unspecified, uncomplicated: Secondary | ICD-10-CM | POA: Insufficient documentation

## 2013-11-06 DIAGNOSIS — J45901 Unspecified asthma with (acute) exacerbation: Secondary | ICD-10-CM | POA: Insufficient documentation

## 2013-11-06 DIAGNOSIS — R05 Cough: Secondary | ICD-10-CM | POA: Insufficient documentation

## 2013-11-06 DIAGNOSIS — Z87828 Personal history of other (healed) physical injury and trauma: Secondary | ICD-10-CM | POA: Insufficient documentation

## 2013-11-06 DIAGNOSIS — Z8669 Personal history of other diseases of the nervous system and sense organs: Secondary | ICD-10-CM | POA: Insufficient documentation

## 2013-11-06 MED ORDER — AZITHROMYCIN 250 MG PO TABS: 250.0000 mg | ORAL_TABLET | Freq: Every day | ORAL | Status: DC

## 2013-11-06 MED ORDER — ALBUTEROL SULFATE HFA 108 (90 BASE) MCG/ACT IN AERS
2.0000 | INHALATION_SPRAY | RESPIRATORY_TRACT | Status: DC | PRN
  Administered 2013-11-06: 2 via RESPIRATORY_TRACT
  Filled 2013-11-06: qty 6.7

## 2013-11-06 MED ORDER — PREDNISONE 20 MG PO TABS: 60.0000 mg | ORAL_TABLET | Freq: Every day | ORAL | Status: DC

## 2013-11-06 NOTE — ED Notes (Signed)
Chest congestion times one week. Fever
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
## 2013-11-06 NOTE — Discharge Instructions (Signed)
Smoking Cessation °Quitting smoking is important to your health and has many advantages. However, it is not always easy to quit since nicotine is a very addictive drug. Oftentimes, people try 3 times or more before being able to quit. This document explains the best ways for you to prepare to quit smoking. Quitting takes hard work and a lot of effort, but you can do it. °ADVANTAGES OF QUITTING SMOKING °· You will live longer, feel better, and live better. °· Your body will feel the impact of quitting smoking almost immediately. °¨ Within 20 minutes, blood pressure decreases. Your pulse returns to its normal level. °¨ After 8 hours, carbon monoxide levels in the blood return to normal. Your oxygen level increases. °¨ After 24 hours, the chance of having a heart attack starts to decrease. Your breath, hair, and body stop smelling like smoke. °¨ After 48 hours, damaged nerve endings begin to recover. Your sense of taste and smell improve. °¨ After 72 hours, the body is virtually free of nicotine. Your bronchial tubes relax and breathing becomes easier. °¨ After 2 to 12 weeks, lungs can hold more air. Exercise becomes easier and circulation improves. °· The risk of having a heart attack, stroke, cancer, or lung disease is greatly reduced. °¨ After 1 year, the risk of coronary heart disease is cut in half. °¨ After 5 years, the risk of stroke falls to the same as a nonsmoker. °¨ After 10 years, the risk of lung cancer is cut in half and the risk of other cancers decreases significantly. °¨ After 15 years, the risk of coronary heart disease drops, usually to the level of a nonsmoker. °· If you are pregnant, quitting smoking will improve your chances of having a healthy baby. °· The people you live with, especially any children, will be healthier. °· You will have extra money to spend on things other than cigarettes. °QUESTIONS TO THINK ABOUT BEFORE ATTEMPTING TO QUIT °You may want to talk about your answers with your  health care provider. °· Why do you want to quit? °· If you tried to quit in the past, what helped and what did not? °· What will be the most difficult situations for you after you quit? How will you plan to handle them? °· Who can help you through the tough times? Your family? Friends? A health care provider? °· What pleasures do you get from smoking? What ways can you still get pleasure if you quit? °Here are some questions to ask your health care provider: °· How can you help me to be successful at quitting? °· What medicine do you think would be best for me and how should I take it? °· What should I do if I need more help? °· What is smoking withdrawal like? How can I get information on withdrawal? °GET READY °· Set a quit date. °· Change your environment by getting rid of all cigarettes, ashtrays, matches, and lighters in your home, car, or work. Do not let people smoke in your home. °· Review your past attempts to quit. Think about what worked and what did not. °GET SUPPORT AND ENCOURAGEMENT °You have a better chance of being successful if you have help. You can get support in many ways. °· Tell your family, friends, and coworkers that you are going to quit and need their support. Ask them not to smoke around you. °· Get individual, group, or telephone counseling and support. Programs are available at local hospitals and health centers. Call   your local health department for information about programs in your area. °· Spiritual beliefs and practices may help some smokers quit. °· Download a "quit meter" on your computer to keep track of quit statistics, such as how long you have gone without smoking, cigarettes not smoked, and money saved. °· Get a self-help book about quitting smoking and staying off tobacco. °LEARN NEW SKILLS AND BEHAVIORS °· Distract yourself from urges to smoke. Talk to someone, go for a walk, or occupy your time with a task. °· Change your normal routine. Take a different route to work.  Drink tea instead of coffee. Eat breakfast in a different place. °· Reduce your stress. Take a hot bath, exercise, or read a book. °· Plan something enjoyable to do every day. Reward yourself for not smoking. °· Explore interactive web-based programs that specialize in helping you quit. °GET MEDICINE AND USE IT CORRECTLY °Medicines can help you stop smoking and decrease the urge to smoke. Combining medicine with the above behavioral methods and support can greatly increase your chances of successfully quitting smoking. °· Nicotine replacement therapy helps deliver nicotine to your body without the negative effects and risks of smoking. Nicotine replacement therapy includes nicotine gum, lozenges, inhalers, nasal sprays, and skin patches. Some may be available over-the-counter and others require a prescription. °· Antidepressant medicine helps people abstain from smoking, but how this works is unknown. This medicine is available by prescription. °· Nicotinic receptor partial agonist medicine simulates the effect of nicotine in your brain. This medicine is available by prescription. °Ask your health care provider for advice about which medicines to use and how to use them based on your health history. Your health care provider will tell you what side effects to look out for if you choose to be on a medicine or therapy. Carefully read the information on the package. Do not use any other product containing nicotine while using a nicotine replacement product.  °RELAPSE OR DIFFICULT SITUATIONS °Most relapses occur within the first 3 months after quitting. Do not be discouraged if you start smoking again. Remember, most people try several times before finally quitting. You may have symptoms of withdrawal because your body is used to nicotine. You may crave cigarettes, be irritable, feel very hungry, cough often, get headaches, or have difficulty concentrating. The withdrawal symptoms are only temporary. They are strongest  when you first quit, but they will go away within 10-14 days. °To reduce the chances of relapse, try to: °· Avoid drinking alcohol. Drinking lowers your chances of successfully quitting. °· Reduce the amount of caffeine you consume. Once you quit smoking, the amount of caffeine in your body increases and can give you symptoms, such as a rapid heartbeat, sweating, and anxiety. °· Avoid smokers because they can make you want to smoke. °· Do not let weight gain distract you. Many smokers will gain weight when they quit, usually less than 10 pounds. Eat a healthy diet and stay active. You can always lose the weight gained after you quit. °· Find ways to improve your mood other than smoking. °FOR MORE INFORMATION  °www.smokefree.gov  °Document Released: 01/24/2001 Document Revised: 06/16/2013 Document Reviewed: 05/11/2011 °ExitCare® Patient Information ©2015 [employer]. This information is not intended to replace advice given to you by your health care provider. Make sure you discuss any questions you have with your health care provider. °Acute Bronchitis °Bronchitis is inflammation of the airways that extend from the windpipe into the lungs (bronchi). The inflammation often causes mucus   to develop. This leads to a cough, which is the most common symptom of bronchitis.  °In acute bronchitis, the condition usually develops suddenly and goes away over time, usually in a couple weeks. Smoking, allergies, and asthma can make bronchitis worse. Repeated episodes of bronchitis may cause further lung problems.  °CAUSES °Acute bronchitis is most often caused by the same virus that causes a cold. The virus can spread from person to person (contagious) through coughing, sneezing, and touching contaminated objects. °SIGNS AND SYMPTOMS  °· Cough.   °· Fever.   °· Coughing up mucus.   °· Body aches.   °· Chest congestion.   °· Chills.   °· Shortness of breath.   °· Sore throat.   °DIAGNOSIS  °Acute bronchitis is usually diagnosed  through a physical exam. Your health care provider will also ask you questions about your medical history. Tests, such as chest X-rays, are sometimes done to rule out other conditions.  °TREATMENT  °Acute bronchitis usually goes away in a couple weeks. Oftentimes, no medical treatment is necessary. Medicines are sometimes given for relief of fever or cough. Antibiotic medicines are usually not needed but may be prescribed in certain situations. In some cases, an inhaler may be recommended to help reduce shortness of breath and control the cough. A cool mist vaporizer may also be used to help thin bronchial secretions and make it easier to clear the chest.  °HOME CARE INSTRUCTIONS °· Get plenty of rest.   °· Drink enough fluids to keep your urine clear or pale yellow (unless you have a medical condition that requires fluid restriction). Increasing fluids may help thin your respiratory secretions (sputum) and reduce chest congestion, and it will prevent dehydration.   °· Take medicines only as directed by your health care provider. °· If you were prescribed an antibiotic medicine, finish it all even if you start to feel better. °· Avoid smoking and secondhand smoke. Exposure to cigarette smoke or irritating chemicals will make bronchitis worse. If you are a smoker, consider using nicotine gum or skin patches to help control withdrawal symptoms. Quitting smoking will help your lungs heal faster.   °· Reduce the chances of another bout of acute bronchitis by washing your hands frequently, avoiding people with cold symptoms, and trying not to touch your hands to your mouth, nose, or eyes.   °· Keep all follow-up visits as directed by your health care provider.   °SEEK MEDICAL CARE IF: °Your symptoms do not improve after 1 week of treatment.  °SEEK IMMEDIATE MEDICAL CARE IF: °· You develop an increased fever or chills.   °· You have chest pain.   °· You have severe shortness of breath. °· You have bloody sputum.   °· You  develop dehydration. °· You faint or repeatedly feel like you are going to pass out. °· You develop repeated vomiting. °· You develop a severe headache. °MAKE SURE YOU:  °· Understand these instructions. °· Will watch your condition. °· Will get help right away if you are not doing well or get worse. °Document Released: 03/09/2004 Document Revised: 06/16/2013 Document Reviewed: 07/23/2012 °ExitCare® Patient Information ©2015 [employer]. This information is not intended to replace advice given to you by your health care provider. Make sure you discuss any questions you have with your health care provider. ° °

## 2013-11-06 NOTE — ED Provider Notes (Signed)
Medical screening examination/treatment/procedure(s) were performed by non-physician practitioner and as supervising physician I was immediately available for consultation/collaboration.   EKG Interpretation None        Layla Maw Mele Sylvester, DO 11/06/13 1512

## 2015-04-14 ENCOUNTER — Encounter (HOSPITAL_COMMUNITY): Payer: Self-pay | Admitting: Emergency Medicine

## 2015-04-14 ENCOUNTER — Emergency Department (HOSPITAL_COMMUNITY): Payer: Self-pay

## 2015-04-14 ENCOUNTER — Emergency Department (HOSPITAL_COMMUNITY)
Admission: EM | Admit: 2015-04-14 | Discharge: 2015-04-14 | Disposition: A | Discharge status: Home / Self Care | Payer: Self-pay | Attending: Emergency Medicine | Admitting: Emergency Medicine

## 2015-04-14 DIAGNOSIS — Z792 Long term (current) use of antibiotics: Secondary | ICD-10-CM | POA: Insufficient documentation

## 2015-04-14 DIAGNOSIS — J45909 Unspecified asthma, uncomplicated: Secondary | ICD-10-CM | POA: Insufficient documentation

## 2015-04-14 DIAGNOSIS — Z791 Long term (current) use of non-steroidal anti-inflammatories (NSAID): Secondary | ICD-10-CM | POA: Insufficient documentation

## 2015-04-14 DIAGNOSIS — R0789 Other chest pain: Secondary | ICD-10-CM | POA: Insufficient documentation

## 2015-04-14 DIAGNOSIS — F17210 Nicotine dependence, cigarettes, uncomplicated: Secondary | ICD-10-CM | POA: Insufficient documentation

## 2015-04-14 MED ORDER — OXYCODONE-ACETAMINOPHEN 5-325 MG PO TABS
1.0000 | ORAL_TABLET | Freq: Once | ORAL | Status: AC
  Administered 2015-04-14: 1 via ORAL
  Filled 2015-04-14: qty 1

## 2015-04-14 MED ORDER — CYCLOBENZAPRINE HCL 10 MG PO TABS: 10.0000 mg | ORAL_TABLET | Freq: Three times a day (TID) | ORAL | Status: DC | PRN

## 2015-04-14 MED ORDER — HYDROCODONE-ACETAMINOPHEN 5-325 MG PO TABS: ORAL_TABLET | ORAL | Status: DC
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
## 2015-04-14 NOTE — ED Notes (Signed)
Pt d/c papers/prescription given and reviewed. Pt verbalized understanding and has no further questions.
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
## 2015-05-19 ENCOUNTER — Encounter (HOSPITAL_COMMUNITY): Payer: Self-pay | Admitting: Emergency Medicine

## 2015-05-19 ENCOUNTER — Emergency Department (HOSPITAL_COMMUNITY): Payer: No Typology Code available for payment source

## 2015-05-19 ENCOUNTER — Emergency Department (HOSPITAL_COMMUNITY)
Admission: EM | Admit: 2015-05-19 | Discharge: 2015-05-19 | Disposition: A | Discharge status: Home / Self Care | Payer: No Typology Code available for payment source | Attending: Emergency Medicine | Admitting: Emergency Medicine

## 2015-05-19 DIAGNOSIS — M546 Pain in thoracic spine: Secondary | ICD-10-CM | POA: Diagnosis present

## 2015-05-19 DIAGNOSIS — R0789 Other chest pain: Secondary | ICD-10-CM

## 2015-05-19 DIAGNOSIS — F17210 Nicotine dependence, cigarettes, uncomplicated: Secondary | ICD-10-CM | POA: Diagnosis not present

## 2015-05-19 DIAGNOSIS — J45909 Unspecified asthma, uncomplicated: Secondary | ICD-10-CM | POA: Insufficient documentation

## 2015-05-19 DIAGNOSIS — Z79899 Other long term (current) drug therapy: Secondary | ICD-10-CM | POA: Diagnosis not present

## 2015-05-19 MED ORDER — MELOXICAM 7.5 MG PO TABS: 7.5000 mg | ORAL_TABLET | Freq: Every day | ORAL | Status: DC

## 2015-05-19 MED ORDER — METHOCARBAMOL 500 MG PO TABS: 500.0000 mg | ORAL_TABLET | Freq: Two times a day (BID) | ORAL | Status: DC
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
## 2015-05-19 NOTE — ED Notes (Signed)
Patient states "I'd rather be admitted than discharged home. She ain't giving me nothing to help with my pain. They gave me percocet last time and that's the only thing that has helped me. I don't care what she says, she can do what she wants. I'll just come back up here later when there's a different doctor here and they will give me my medicine. I'll try these medicines she's giving me but I don't think they will work so I'll probably be back."
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
## 2015-05-19 NOTE — Discharge Instructions (Signed)

## 2015-08-25 DIAGNOSIS — Y939 Activity, unspecified: Secondary | ICD-10-CM | POA: Insufficient documentation

## 2015-08-25 DIAGNOSIS — Y929 Unspecified place or not applicable: Secondary | ICD-10-CM | POA: Insufficient documentation

## 2015-08-25 DIAGNOSIS — Z79899 Other long term (current) drug therapy: Secondary | ICD-10-CM | POA: Insufficient documentation

## 2015-08-25 DIAGNOSIS — S0083XA Contusion of other part of head, initial encounter: Secondary | ICD-10-CM | POA: Insufficient documentation

## 2015-08-25 DIAGNOSIS — Y999 Unspecified external cause status: Secondary | ICD-10-CM | POA: Insufficient documentation

## 2015-08-25 DIAGNOSIS — F17210 Nicotine dependence, cigarettes, uncomplicated: Secondary | ICD-10-CM | POA: Insufficient documentation

## 2015-08-25 DIAGNOSIS — J45909 Unspecified asthma, uncomplicated: Secondary | ICD-10-CM | POA: Insufficient documentation

## 2015-08-26 ENCOUNTER — Emergency Department (HOSPITAL_COMMUNITY)
Admission: EM | Admit: 2015-08-26 | Discharge: 2015-08-26 | Disposition: A | Discharge status: Home / Self Care | Payer: Self-pay | Attending: Emergency Medicine | Admitting: Emergency Medicine

## 2015-08-26 ENCOUNTER — Encounter (HOSPITAL_COMMUNITY): Payer: Self-pay

## 2015-08-26 ENCOUNTER — Emergency Department (HOSPITAL_COMMUNITY): Payer: Self-pay

## 2015-08-26 DIAGNOSIS — S0083XA Contusion of other part of head, initial encounter: Secondary | ICD-10-CM

## 2015-08-26 MED ORDER — OXYCODONE-ACETAMINOPHEN 5-325 MG PO TABS
1.0000 | ORAL_TABLET | Freq: Once | ORAL | Status: AC
  Administered 2015-08-26: 1 via ORAL
  Filled 2015-08-26: qty 1

## 2015-08-26 MED ORDER — IBUPROFEN 400 MG PO TABS: 400.0000 mg | ORAL_TABLET | Freq: Four times a day (QID) | ORAL | Status: DC | PRN

## 2015-08-26 NOTE — Discharge Instructions (Signed)
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
## 2016-01-18 ENCOUNTER — Emergency Department (HOSPITAL_COMMUNITY)
Admission: EM | Admit: 2016-01-18 | Discharge: 2016-01-18 | Disposition: A | Discharge status: Home / Self Care | Payer: Self-pay | Attending: Emergency Medicine | Admitting: Emergency Medicine

## 2016-01-18 ENCOUNTER — Encounter (HOSPITAL_COMMUNITY): Payer: Self-pay | Admitting: Emergency Medicine

## 2016-01-18 DIAGNOSIS — J45909 Unspecified asthma, uncomplicated: Secondary | ICD-10-CM | POA: Insufficient documentation

## 2016-01-18 DIAGNOSIS — L299 Pruritus, unspecified: Secondary | ICD-10-CM

## 2016-01-18 DIAGNOSIS — L509 Urticaria, unspecified: Secondary | ICD-10-CM | POA: Insufficient documentation

## 2016-01-18 DIAGNOSIS — Z791 Long term (current) use of non-steroidal anti-inflammatories (NSAID): Secondary | ICD-10-CM | POA: Insufficient documentation

## 2016-01-18 DIAGNOSIS — Z79899 Other long term (current) drug therapy: Secondary | ICD-10-CM | POA: Insufficient documentation

## 2016-01-18 DIAGNOSIS — F17210 Nicotine dependence, cigarettes, uncomplicated: Secondary | ICD-10-CM | POA: Insufficient documentation

## 2016-01-18 MED ORDER — FAMOTIDINE IN NACL 20-0.9 MG/50ML-% IV SOLN
20.0000 mg | Freq: Once | INTRAVENOUS | Status: AC
  Administered 2016-01-18: 20 mg via INTRAVENOUS
  Filled 2016-01-18: qty 50

## 2016-01-18 MED ORDER — HYDROXYZINE HCL 25 MG PO TABS: ORAL_TABLET | ORAL | 0 refills | Status: DC

## 2016-01-18 MED ORDER — FAMOTIDINE 20 MG PO TABS: 20.0000 mg | ORAL_TABLET | Freq: Two times a day (BID) | ORAL | 0 refills | Status: DC

## 2016-01-18 MED ORDER — DIPHENHYDRAMINE HCL 50 MG/ML IJ SOLN
50.0000 mg | Freq: Once | INTRAMUSCULAR | Status: AC
  Administered 2016-01-18: 50 mg via INTRAVENOUS
  Filled 2016-01-18: qty 1

## 2016-01-18 MED ORDER — PREDNISONE 20 MG PO TABS: ORAL_TABLET | ORAL | 0 refills | Status: DC

## 2016-01-18 MED ORDER — HYDROXYZINE HCL 25 MG PO TABS
50.0000 mg | ORAL_TABLET | Freq: Once | ORAL | Status: AC
  Administered 2016-01-18: 50 mg via ORAL
  Filled 2016-01-18: qty 2

## 2016-01-18 MED ORDER — METHYLPREDNISOLONE SODIUM SUCC 125 MG IJ SOLR
125.0000 mg | Freq: Once | INTRAMUSCULAR | Status: AC
  Administered 2016-01-18: 125 mg via INTRAVENOUS
  Filled 2016-01-18: qty 2

## 2016-01-18 NOTE — ED Notes (Signed)
Pt given icepack to apply to areas of skin that are itching.

## 2016-01-18 NOTE — ED Triage Notes (Signed)
Pt started getting welts today, worse in groin

## 2016-01-18 NOTE — Discharge Instructions (Signed)
Stay cool, heat will make the rash and itching worse. Take the medication as prescribed. You can also take zyrtec 10 mg OTC once a day OR benadryl 50 mg every 6 hrs for itching or rash. Be careful take the benadryl and hydroxyzine together can make you sleepy. Recheck if you get swelling of your lips or in your mouth, or you have difficulty breathing or swallowing.
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
## 2016-07-29 ENCOUNTER — Emergency Department (HOSPITAL_COMMUNITY)
Admission: EM | Admit: 2016-07-29 | Discharge: 2016-07-29 | Disposition: A | Discharge status: Home / Self Care | Payer: Self-pay | Attending: Emergency Medicine | Admitting: Emergency Medicine

## 2016-07-29 DIAGNOSIS — Y92007 Garden or yard of unspecified non-institutional (private) residence as the place of occurrence of the external cause: Secondary | ICD-10-CM | POA: Insufficient documentation

## 2016-07-29 DIAGNOSIS — Y999 Unspecified external cause status: Secondary | ICD-10-CM | POA: Insufficient documentation

## 2016-07-29 DIAGNOSIS — Y9389 Activity, other specified: Secondary | ICD-10-CM | POA: Insufficient documentation

## 2016-07-29 DIAGNOSIS — L02511 Cutaneous abscess of right hand: Secondary | ICD-10-CM | POA: Insufficient documentation

## 2016-07-29 DIAGNOSIS — J45909 Unspecified asthma, uncomplicated: Secondary | ICD-10-CM | POA: Insufficient documentation

## 2016-07-29 DIAGNOSIS — F17210 Nicotine dependence, cigarettes, uncomplicated: Secondary | ICD-10-CM | POA: Insufficient documentation

## 2016-07-29 DIAGNOSIS — X58XXXA Exposure to other specified factors, initial encounter: Secondary | ICD-10-CM | POA: Insufficient documentation

## 2016-07-29 MED ORDER — CEPHALEXIN 500 MG PO CAPS: 500.0000 mg | ORAL_CAPSULE | Freq: Four times a day (QID) | ORAL | 0 refills | Status: AC

## 2016-07-29 MED ORDER — LIDOCAINE HCL (PF) 2 % IJ SOLN
INTRAMUSCULAR | Status: AC
  Filled 2016-07-29: qty 10

## 2016-07-29 NOTE — Discharge Instructions (Signed)
You had a small abscess drained today.  You are being discharged with a 10 day supply of antibiotics to take.  If you see worsening swelling, redness, your joints in that finger start becoming swollen/hot/red, you develop fever, chills, nausea, vomiting seek medical attention.

## 2016-07-29 NOTE — ED Triage Notes (Signed)
Pt is complaining of right ring finger pain. States he noticed it swollen and red yesterday. Has been putting peroxide and alcohol on it. States the pain feels like it's throbbing and rates pain as a 6/10.
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
## 2016-08-16 ENCOUNTER — Encounter (HOSPITAL_COMMUNITY): Payer: Self-pay | Admitting: *Deleted

## 2016-08-16 ENCOUNTER — Emergency Department (HOSPITAL_COMMUNITY): Payer: Self-pay

## 2016-08-16 ENCOUNTER — Emergency Department (HOSPITAL_COMMUNITY)
Admission: EM | Admit: 2016-08-16 | Discharge: 2016-08-16 | Disposition: A | Discharge status: Home / Self Care | Payer: Self-pay | Attending: Emergency Medicine | Admitting: Emergency Medicine

## 2016-08-16 DIAGNOSIS — Y939 Activity, unspecified: Secondary | ICD-10-CM | POA: Insufficient documentation

## 2016-08-16 DIAGNOSIS — Y999 Unspecified external cause status: Secondary | ICD-10-CM | POA: Insufficient documentation

## 2016-08-16 DIAGNOSIS — S82122A Displaced fracture of lateral condyle of left tibia, initial encounter for closed fracture: Secondary | ICD-10-CM | POA: Insufficient documentation

## 2016-08-16 DIAGNOSIS — J45909 Unspecified asthma, uncomplicated: Secondary | ICD-10-CM | POA: Insufficient documentation

## 2016-08-16 DIAGNOSIS — F17210 Nicotine dependence, cigarettes, uncomplicated: Secondary | ICD-10-CM | POA: Insufficient documentation

## 2016-08-16 DIAGNOSIS — Y929 Unspecified place or not applicable: Secondary | ICD-10-CM | POA: Insufficient documentation

## 2016-08-16 MED ORDER — OXYCODONE-ACETAMINOPHEN 5-325 MG PO TABS: ORAL_TABLET | ORAL | 0 refills | Status: DC

## 2016-08-16 MED ORDER — OXYCODONE-ACETAMINOPHEN 5-325 MG PO TABS
2.0000 | ORAL_TABLET | Freq: Once | ORAL | Status: AC
  Administered 2016-08-16: 2 via ORAL
  Filled 2016-08-16: qty 2

## 2016-08-16 MED ORDER — FENTANYL CITRATE (PF) 100 MCG/2ML IJ SOLN
50.0000 ug | Freq: Once | INTRAMUSCULAR | Status: AC
  Administered 2016-08-16: 50 ug via INTRAVENOUS
  Filled 2016-08-16: qty 2

## 2016-08-16 NOTE — ED Notes (Signed)
Patient transported to X-ray 

## 2016-08-16 NOTE — Discharge Instructions (Signed)
Take the prescriptions as directed.  Apply moist heat or ice to the area(s) of discomfort, for 15 minutes at a time, several times per day for the next few days.  Do not fall asleep on a heating or ice pack. Wear the knee immobilizer and use the crutches to walk (no weight bearing on your left leg) until you are seen in follow up.  Call your regular Orthopedic doctor tomorrow morning to schedule a follow up appointment in the next 1 to 2 days.  Return to the Emergency Department immediately if worsening.
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
## 2016-08-16 NOTE — ED Notes (Signed)
Patient transported to CT 
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
## 2016-08-16 NOTE — ED Notes (Addendum)
Pt given 50 mcg of fentanyl IV in route by ems; pedal pulse to left foot

## 2016-08-18 ENCOUNTER — Encounter: Payer: Self-pay | Admitting: Orthopedic Surgery

## 2016-08-18 ENCOUNTER — Ambulatory Visit (INDEPENDENT_AMBULATORY_CARE_PROVIDER_SITE_OTHER): Payer: Self-pay | Admitting: Orthopedic Surgery

## 2016-08-18 VITALS — BP 111/77 | HR 72 | Temp 97.2°F

## 2016-08-18 DIAGNOSIS — S82142A Displaced bicondylar fracture of left tibia, initial encounter for closed fracture: Secondary | ICD-10-CM

## 2016-08-18 MED ORDER — OXYCODONE-ACETAMINOPHEN 5-325 MG PO TABS: 1.0000 | ORAL_TABLET | ORAL | 0 refills | Status: DC | PRN
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
## 2016-08-18 NOTE — Patient Instructions (Signed)
No weight bearing   Apply ice 3 x a day for 20 min (place cloth between ice and skin)

## 2016-08-18 NOTE — Addendum Note (Signed)
Addended by: Adella HareBOOTHE, Kassity Woodson B on: 08/18/2016 08:55 AM   Modules accepted: Orders

## 2016-08-23 ENCOUNTER — Telehealth: Payer: Self-pay | Admitting: Orthopedic Surgery

## 2016-08-23 NOTE — Telephone Encounter (Signed)
Patient called (on 08/23/16) to follow up on referral which was made on Friday, 08/18/16, to Dr Carola FrostHandy. I had called their office to check status, as our clinical staff had not received appointment information. Per Dedra SkeensGwen, states Dr Carola FrostHandy would be reviewing it.  States would be in touch with our office or with patient as soon as possible. Patient called back 08/24/16; I relayed that I had called back to Dr Magdalene PatriciaHandy's office; Dedra SkeensGwen states that Dr Carola FrostHandy may need to refer to another specialist, as he is scheduled to be out of office.  Appointment still pending.  I relayed this information to patient.  In addition, patient is requesting refill, as said "running out of pain medication today."  oxyCODONE-acetaminophen (PERCOCET/ROXICET) 5-325 MG tablet 42 tablet  Patient aware Dr Romeo AppleHarrison is out of office this week, and that Dr Hilda LiasKeeling will review.  Please advise.

## 2016-08-24 ENCOUNTER — Inpatient Hospital Stay (HOSPITAL_COMMUNITY): Payer: Self-pay

## 2016-08-24 ENCOUNTER — Inpatient Hospital Stay (HOSPITAL_COMMUNITY)
Admission: RE | Admit: 2016-08-24 | Discharge: 2016-08-25 | DRG: 488 | Disposition: A | Discharge status: Home / Self Care | Payer: Self-pay | Source: Ambulatory Visit | Attending: Orthopedic Surgery | Admitting: Orthopedic Surgery

## 2016-08-24 ENCOUNTER — Ambulatory Visit (HOSPITAL_COMMUNITY): Payer: Self-pay | Admitting: Certified Registered Nurse Anesthetist

## 2016-08-24 ENCOUNTER — Ambulatory Visit (HOSPITAL_COMMUNITY): Payer: Self-pay

## 2016-08-24 ENCOUNTER — Encounter (HOSPITAL_COMMUNITY): Payer: Self-pay | Admitting: Certified Registered Nurse Anesthetist

## 2016-08-24 ENCOUNTER — Encounter (HOSPITAL_COMMUNITY)
Admission: RE | Disposition: A | Discharge status: Home / Self Care | Payer: Self-pay | Source: Ambulatory Visit | Attending: Orthopedic Surgery

## 2016-08-24 DIAGNOSIS — F17210 Nicotine dependence, cigarettes, uncomplicated: Secondary | ICD-10-CM | POA: Diagnosis present

## 2016-08-24 DIAGNOSIS — S82142A Displaced bicondylar fracture of left tibia, initial encounter for closed fracture: Principal | ICD-10-CM | POA: Diagnosis present

## 2016-08-24 DIAGNOSIS — S83282A Other tear of lateral meniscus, current injury, left knee, initial encounter: Secondary | ICD-10-CM | POA: Diagnosis present

## 2016-08-24 DIAGNOSIS — Z01818 Encounter for other preprocedural examination: Secondary | ICD-10-CM

## 2016-08-24 DIAGNOSIS — E8889 Other specified metabolic disorders: Secondary | ICD-10-CM | POA: Diagnosis present

## 2016-08-24 DIAGNOSIS — D62 Acute posthemorrhagic anemia: Secondary | ICD-10-CM | POA: Diagnosis not present

## 2016-08-24 DIAGNOSIS — F17200 Nicotine dependence, unspecified, uncomplicated: Secondary | ICD-10-CM | POA: Diagnosis present

## 2016-08-24 DIAGNOSIS — T148XXA Other injury of unspecified body region, initial encounter: Secondary | ICD-10-CM

## 2016-08-24 DIAGNOSIS — Z9109 Other allergy status, other than to drugs and biological substances: Secondary | ICD-10-CM

## 2016-08-24 DIAGNOSIS — Z01811 Encounter for preprocedural respiratory examination: Secondary | ICD-10-CM

## 2016-08-24 DIAGNOSIS — Z419 Encounter for procedure for purposes other than remedying health state, unspecified: Secondary | ICD-10-CM

## 2016-08-24 DIAGNOSIS — F1910 Other psychoactive substance abuse, uncomplicated: Secondary | ICD-10-CM | POA: Diagnosis present

## 2016-08-24 HISTORY — PX: MENISCUS REPAIR: SHX5179

## 2016-08-24 HISTORY — PX: FASCIOTOMY: SHX132

## 2016-08-24 HISTORY — PX: ORIF TIBIA PLATEAU: SHX2132

## 2016-08-24 HISTORY — DX: Nicotine dependence, unspecified, uncomplicated: F17.200

## 2016-08-24 HISTORY — DX: Other psychoactive substance abuse, uncomplicated: F19.10

## 2016-08-24 LAB — URINALYSIS, ROUTINE W REFLEX MICROSCOPIC
Bilirubin Urine: NEGATIVE
Glucose, UA: NEGATIVE mg/dL
HGB URINE DIPSTICK: NEGATIVE
Ketones, ur: NEGATIVE mg/dL
Leukocytes, UA: NEGATIVE
NITRITE: NEGATIVE
PROTEIN: NEGATIVE mg/dL
SPECIFIC GRAVITY, URINE: 1.026 (ref 1.005–1.030)
pH: 6 (ref 5.0–8.0)

## 2016-08-24 LAB — SURGICAL PCR SCREEN
MRSA, PCR: NEGATIVE
STAPHYLOCOCCUS AUREUS: NEGATIVE

## 2016-08-24 LAB — RAPID URINE DRUG SCREEN, HOSP PERFORMED
AMPHETAMINES: NOT DETECTED
BARBITURATES: NOT DETECTED
Benzodiazepines: POSITIVE — AB
COCAINE: POSITIVE — AB
Opiates: POSITIVE — AB
TETRAHYDROCANNABINOL: POSITIVE — AB

## 2016-08-24 LAB — ABO/RH: ABO/RH(D): O POS

## 2016-08-24 LAB — COMPREHENSIVE METABOLIC PANEL
ALBUMIN: 3.9 g/dL (ref 3.5–5.0)
ALT: 14 U/L — ABNORMAL LOW (ref 17–63)
ANION GAP: 9 (ref 5–15)
AST: 25 U/L (ref 15–41)
Alkaline Phosphatase: 55 U/L (ref 38–126)
BUN: 5 mg/dL — AB (ref 6–20)
CHLORIDE: 103 mmol/L (ref 101–111)
CO2: 25 mmol/L (ref 22–32)
Calcium: 9 mg/dL (ref 8.9–10.3)
Creatinine, Ser: 0.85 mg/dL (ref 0.61–1.24)
GFR calc Af Amer: >60 mL/min (ref >60)
GFR calc non Af Amer: >60 mL/min (ref >60)
GLUCOSE: 101 mg/dL — AB (ref 65–99)
POTASSIUM: 3.7 mmol/L (ref 3.5–5.1)
SODIUM: 137 mmol/L (ref 135–145)
Total Bilirubin: 0.9 mg/dL (ref 0.3–1.2)
Total Protein: 6.5 g/dL (ref 6.5–8.1)

## 2016-08-24 LAB — CBC WITH DIFFERENTIAL/PLATELET
BASOS PCT: 0 %
Basophils Absolute: 0 10*3/uL (ref 0.0–0.1)
EOS ABS: 0.3 10*3/uL (ref 0.0–0.7)
Eosinophils Relative: 4 %
HEMATOCRIT: 42.6 % (ref 39.0–52.0)
Hemoglobin: 14 g/dL (ref 13.0–17.0)
Lymphocytes Relative: 28 %
Lymphs Abs: 2.1 10*3/uL (ref 0.7–4.0)
MCH: 30.9 pg (ref 26.0–34.0)
MCHC: 32.9 g/dL (ref 30.0–36.0)
MCV: 94 fL (ref 78.0–100.0)
MONO ABS: 0.5 10*3/uL (ref 0.1–1.0)
Monocytes Relative: 7 %
NEUTROS ABS: 4.5 10*3/uL (ref 1.7–7.7)
Neutrophils Relative %: 61 %
PLATELETS: 353 10*3/uL (ref 150–400)
RBC: 4.53 MIL/uL (ref 4.22–5.81)
RDW: 13.1 % (ref 11.5–15.5)
WBC: 7.5 10*3/uL (ref 4.0–10.5)

## 2016-08-24 LAB — APTT: APTT: 33 s (ref 24–36)

## 2016-08-24 LAB — PROTIME-INR
INR: 0.93
Prothrombin Time: 12.5 seconds (ref 11.4–15.2)

## 2016-08-24 SURGERY — OPEN REDUCTION INTERNAL FIXATION (ORIF) TIBIAL PLATEAU
Anesthesia: General | Site: Leg Lower | Laterality: Left

## 2016-08-24 MED ORDER — SUGAMMADEX SODIUM 200 MG/2ML IV SOLN
INTRAVENOUS | Status: DC | PRN
  Administered 2016-08-24: 150 mg via INTRAVENOUS

## 2016-08-24 MED ORDER — PHENYLEPHRINE HCL 10 MG/ML IJ SOLN
INTRAMUSCULAR | Status: DC | PRN
  Administered 2016-08-24 (×2): 80 ug via INTRAVENOUS

## 2016-08-24 MED ORDER — OXYCODONE-ACETAMINOPHEN 5-325 MG PO TABS: 1.0000 | ORAL_TABLET | ORAL | 0 refills | Status: DC | PRN

## 2016-08-24 MED ORDER — ONDANSETRON HCL 4 MG/2ML IJ SOLN: 4.0000 mg | Freq: Once | INTRAMUSCULAR | Status: DC | PRN

## 2016-08-24 MED ORDER — 0.9 % SODIUM CHLORIDE (POUR BTL) OPTIME
TOPICAL | Status: DC | PRN
  Administered 2016-08-24: 1000 mL

## 2016-08-24 MED ORDER — FENTANYL CITRATE (PF) 250 MCG/5ML IJ SOLN
INTRAMUSCULAR | Status: AC
  Filled 2016-08-24: qty 5

## 2016-08-24 MED ORDER — METHOCARBAMOL 1000 MG/10ML IJ SOLN
1000.0000 mg | Freq: Four times a day (QID) | INTRAVENOUS | Status: DC | PRN
  Administered 2016-08-24: 1000 mg via INTRAVENOUS
  Filled 2016-08-24 (×2): qty 10

## 2016-08-24 MED ORDER — MIDAZOLAM HCL 5 MG/5ML IJ SOLN
INTRAMUSCULAR | Status: DC | PRN
  Administered 2016-08-24: 2 mg via INTRAVENOUS

## 2016-08-24 MED ORDER — MIDAZOLAM HCL 2 MG/2ML IJ SOLN
INTRAMUSCULAR | Status: AC
  Administered 2016-08-24: 1 mg via INTRAVENOUS
  Filled 2016-08-24: qty 2

## 2016-08-24 MED ORDER — METHOCARBAMOL 500 MG PO TABS
500.0000 mg | ORAL_TABLET | Freq: Four times a day (QID) | ORAL | Status: DC | PRN
  Administered 2016-08-24 – 2016-08-25 (×2): 500 mg via ORAL
  Administered 2016-08-25 (×2): 1000 mg via ORAL
  Filled 2016-08-24 (×3): qty 2

## 2016-08-24 MED ORDER — ACETAMINOPHEN 325 MG PO TABS
650.0000 mg | ORAL_TABLET | Freq: Four times a day (QID) | ORAL | Status: DC | PRN
  Administered 2016-08-25 (×2): 650 mg via ORAL
  Filled 2016-08-24 (×2): qty 2

## 2016-08-24 MED ORDER — MIDAZOLAM HCL 2 MG/2ML IJ SOLN
1.0000 mg | Freq: Once | INTRAMUSCULAR | Status: AC
  Administered 2016-08-24: 1 mg via INTRAVENOUS

## 2016-08-24 MED ORDER — FENTANYL CITRATE (PF) 100 MCG/2ML IJ SOLN: 25.0000 ug | INTRAMUSCULAR | Status: DC | PRN

## 2016-08-24 MED ORDER — HYDROMORPHONE HCL 1 MG/ML IJ SOLN
1.0000 mg | INTRAMUSCULAR | Status: DC | PRN
  Administered 2016-08-24 (×3): 1 mg via INTRAVENOUS
  Administered 2016-08-25 (×5): 2 mg via INTRAVENOUS
  Filled 2016-08-24 (×6): qty 2

## 2016-08-24 MED ORDER — CEFAZOLIN SODIUM 1 G IJ SOLR
INTRAMUSCULAR | Status: AC
  Filled 2016-08-24: qty 30

## 2016-08-24 MED ORDER — SUGAMMADEX SODIUM 200 MG/2ML IV SOLN
INTRAVENOUS | Status: AC
  Filled 2016-08-24: qty 2

## 2016-08-24 MED ORDER — MIDAZOLAM HCL 2 MG/2ML IJ SOLN
1.0000 mg | Freq: Once | INTRAMUSCULAR | Status: AC
Start: 2016-08-24 — End: 2016-08-24
  Administered 2016-08-24: 1 mg via INTRAVENOUS

## 2016-08-24 MED ORDER — GLYCOPYRROLATE 0.2 MG/ML IJ SOLN
INTRAMUSCULAR | Status: DC | PRN
  Administered 2016-08-24: 0.1 mg via INTRAVENOUS

## 2016-08-24 MED ORDER — CHLORHEXIDINE GLUCONATE 4 % EX LIQD: 60.0000 mL | Freq: Once | CUTANEOUS | Status: DC

## 2016-08-24 MED ORDER — ROCURONIUM BROMIDE 100 MG/10ML IV SOLN
INTRAVENOUS | Status: DC | PRN
  Administered 2016-08-24: 50 mg via INTRAVENOUS
  Administered 2016-08-24: 10 mg via INTRAVENOUS
  Administered 2016-08-24: 15 mg via INTRAVENOUS

## 2016-08-24 MED ORDER — FENTANYL CITRATE (PF) 100 MCG/2ML IJ SOLN
INTRAMUSCULAR | Status: AC
  Administered 2016-08-24: 50 ug via INTRAVENOUS
  Filled 2016-08-24: qty 2

## 2016-08-24 MED ORDER — METOCLOPRAMIDE HCL 5 MG PO TABS: 5.0000 mg | ORAL_TABLET | Freq: Three times a day (TID) | ORAL | Status: DC | PRN

## 2016-08-24 MED ORDER — OXYCODONE HCL 5 MG PO TABS
ORAL_TABLET | ORAL | Status: AC
  Administered 2016-08-24: 10 mg via ORAL
  Filled 2016-08-24: qty 2

## 2016-08-24 MED ORDER — ENOXAPARIN SODIUM 40 MG/0.4ML ~~LOC~~ SOLN
40.0000 mg | SUBCUTANEOUS | Status: DC
  Administered 2016-08-25: 40 mg via SUBCUTANEOUS
  Filled 2016-08-24: qty 0.4

## 2016-08-24 MED ORDER — OXYCODONE HCL 5 MG/5ML PO SOLN: 5.0000 mg | Freq: Once | ORAL | Status: DC | PRN

## 2016-08-24 MED ORDER — PHENYLEPHRINE 40 MCG/ML (10ML) SYRINGE FOR IV PUSH (FOR BLOOD PRESSURE SUPPORT)
PREFILLED_SYRINGE | INTRAVENOUS | Status: AC
  Filled 2016-08-24: qty 10

## 2016-08-24 MED ORDER — MIDAZOLAM HCL 2 MG/2ML IJ SOLN
INTRAMUSCULAR | Status: AC
  Filled 2016-08-24: qty 2

## 2016-08-24 MED ORDER — POLYETHYLENE GLYCOL 3350 17 G PO PACK
17.0000 g | PACK | Freq: Every day | ORAL | Status: DC
  Administered 2016-08-25: 17 g via ORAL
  Filled 2016-08-24: qty 1

## 2016-08-24 MED ORDER — ACETAMINOPHEN 10 MG/ML IV SOLN
1000.0000 mg | Freq: Four times a day (QID) | INTRAVENOUS | Status: DC
  Administered 2016-08-24: 1000 mg via INTRAVENOUS
  Filled 2016-08-24: qty 100

## 2016-08-24 MED ORDER — DEXAMETHASONE SODIUM PHOSPHATE 10 MG/ML IJ SOLN
INTRAMUSCULAR | Status: DC | PRN
  Administered 2016-08-24: 10 mg via INTRAVENOUS

## 2016-08-24 MED ORDER — HYDROMORPHONE HCL 1 MG/ML IJ SOLN
INTRAMUSCULAR | Status: AC
  Filled 2016-08-24: qty 1

## 2016-08-24 MED ORDER — OXYCODONE HCL 5 MG PO TABS
5.0000 mg | ORAL_TABLET | Freq: Four times a day (QID) | ORAL | Status: DC | PRN
  Administered 2016-08-24: 10 mg via ORAL
  Administered 2016-08-25 (×3): 15 mg via ORAL
  Filled 2016-08-24 (×3): qty 3

## 2016-08-24 MED ORDER — FENTANYL CITRATE (PF) 100 MCG/2ML IJ SOLN
INTRAMUSCULAR | Status: DC | PRN
  Administered 2016-08-24: 50 ug via INTRAVENOUS
  Administered 2016-08-24: 100 ug via INTRAVENOUS
  Administered 2016-08-24 (×5): 50 ug via INTRAVENOUS

## 2016-08-24 MED ORDER — ACETAMINOPHEN 10 MG/ML IV SOLN: 1000.0000 mg | INTRAVENOUS | Status: DC

## 2016-08-24 MED ORDER — METOCLOPRAMIDE HCL 5 MG/ML IJ SOLN: 5.0000 mg | Freq: Three times a day (TID) | INTRAMUSCULAR | Status: DC | PRN

## 2016-08-24 MED ORDER — LACTATED RINGERS IV SOLN
INTRAVENOUS | Status: DC
  Administered 2016-08-24 (×5): via INTRAVENOUS

## 2016-08-24 MED ORDER — MAGNESIUM CITRATE PO SOLN: 1.0000 | Freq: Once | ORAL | Status: DC | PRN

## 2016-08-24 MED ORDER — ONDANSETRON HCL 4 MG/2ML IJ SOLN
INTRAMUSCULAR | Status: AC
  Filled 2016-08-24: qty 2

## 2016-08-24 MED ORDER — ONDANSETRON HCL 4 MG/2ML IJ SOLN: 4.0000 mg | Freq: Four times a day (QID) | INTRAMUSCULAR | Status: DC | PRN

## 2016-08-24 MED ORDER — FENTANYL CITRATE (PF) 100 MCG/2ML IJ SOLN
50.0000 ug | Freq: Once | INTRAMUSCULAR | Status: AC
  Administered 2016-08-24: 50 ug via INTRAVENOUS

## 2016-08-24 MED ORDER — ACETAMINOPHEN 650 MG RE SUPP: 650.0000 mg | Freq: Four times a day (QID) | RECTAL | Status: DC | PRN

## 2016-08-24 MED ORDER — PROPOFOL 10 MG/ML IV BOLUS
INTRAVENOUS | Status: DC | PRN
  Administered 2016-08-24: 100 mg via INTRAVENOUS
  Administered 2016-08-24: 20 mg via INTRAVENOUS

## 2016-08-24 MED ORDER — ONDANSETRON HCL 4 MG PO TABS: 4.0000 mg | ORAL_TABLET | Freq: Four times a day (QID) | ORAL | Status: DC | PRN

## 2016-08-24 MED ORDER — LIDOCAINE HCL (CARDIAC) 20 MG/ML IV SOLN
INTRAVENOUS | Status: AC
  Filled 2016-08-24: qty 5

## 2016-08-24 MED ORDER — BISACODYL 5 MG PO TBEC: 5.0000 mg | DELAYED_RELEASE_TABLET | Freq: Every day | ORAL | Status: DC | PRN

## 2016-08-24 MED ORDER — DEXAMETHASONE SODIUM PHOSPHATE 10 MG/ML IJ SOLN
INTRAMUSCULAR | Status: AC
  Filled 2016-08-24: qty 1

## 2016-08-24 MED ORDER — POTASSIUM CHLORIDE IN NACL 20-0.9 MEQ/L-% IV SOLN
INTRAVENOUS | Status: DC
  Administered 2016-08-24: 22:00:00 via INTRAVENOUS
  Filled 2016-08-24 (×2): qty 1000

## 2016-08-24 MED ORDER — CEFAZOLIN SODIUM-DEXTROSE 2-4 GM/100ML-% IV SOLN
2.0000 g | INTRAVENOUS | Status: AC
  Administered 2016-08-24: 2 g via INTRAVENOUS
  Filled 2016-08-24: qty 100

## 2016-08-24 MED ORDER — OXYCODONE HCL 5 MG PO TABS: 5.0000 mg | ORAL_TABLET | Freq: Once | ORAL | Status: DC | PRN

## 2016-08-24 MED ORDER — HYDROMORPHONE HCL 1 MG/ML IJ SOLN
INTRAMUSCULAR | Status: AC
  Administered 2016-08-24: 1 mg via INTRAVENOUS
  Filled 2016-08-24: qty 1

## 2016-08-24 MED ORDER — DOCUSATE SODIUM 100 MG PO CAPS
100.0000 mg | ORAL_CAPSULE | Freq: Two times a day (BID) | ORAL | Status: DC
  Administered 2016-08-25: 100 mg via ORAL
  Filled 2016-08-24 (×2): qty 1

## 2016-08-24 MED ORDER — ONDANSETRON HCL 4 MG/2ML IJ SOLN
INTRAMUSCULAR | Status: DC | PRN
  Administered 2016-08-24: 4 mg via INTRAVENOUS

## 2016-08-24 MED ORDER — CEFAZOLIN SODIUM-DEXTROSE 1-4 GM/50ML-% IV SOLN
1.0000 g | Freq: Four times a day (QID) | INTRAVENOUS | Status: AC
  Administered 2016-08-25 (×3): 1 g via INTRAVENOUS
  Filled 2016-08-24 (×3): qty 50

## 2016-08-24 MED ORDER — ROCURONIUM BROMIDE 50 MG/5ML IV SOLN
INTRAVENOUS | Status: AC
  Filled 2016-08-24: qty 1

## 2016-08-24 MED ORDER — PROPOFOL 10 MG/ML IV BOLUS
INTRAVENOUS | Status: AC
  Filled 2016-08-24: qty 20

## 2016-08-24 MED ORDER — METHOCARBAMOL 500 MG PO TABS
ORAL_TABLET | ORAL | Status: AC
  Administered 2016-08-24: 500 mg via ORAL
  Filled 2016-08-24: qty 1

## 2016-08-24 SURGICAL SUPPLY — 89 items
BANDAGE ACE 4X5 VEL STRL LF (GAUZE/BANDAGES/DRESSINGS) ×5 IMPLANT
BANDAGE ACE 6X5 VEL STRL LF (GAUZE/BANDAGES/DRESSINGS) ×5 IMPLANT
BANDAGE ESMARK 6X9 LF (GAUZE/BANDAGES/DRESSINGS) ×3 IMPLANT
BIT DRILL 100X2.5XANTM LCK (BIT) ×1 IMPLANT
BIT DRL 100X2.5XANTM LCK (BIT) ×3
BLADE CLIPPER SURG (BLADE) IMPLANT
BLADE SURG 10 STRL SS (BLADE) ×5 IMPLANT
BLADE SURG 15 STRL LF DISP TIS (BLADE) ×3 IMPLANT
BLADE SURG 15 STRL SS (BLADE) ×5
BNDG CMPR 9X6 STRL LF SNTH (GAUZE/BANDAGES/DRESSINGS) ×3
BNDG COHESIVE 4X5 TAN STRL (GAUZE/BANDAGES/DRESSINGS) ×5 IMPLANT
BNDG ESMARK 6X9 LF (GAUZE/BANDAGES/DRESSINGS) ×5
BNDG GAUZE ELAST 4 BULKY (GAUZE/BANDAGES/DRESSINGS) ×5 IMPLANT
BONE CANC CHIPS 40CC CAN1/2 (Bone Implant) ×5 IMPLANT
BRUSH SCRUB SURG 4.25 DISP (MISCELLANEOUS) ×10 IMPLANT
CANISTER SUCT 3000ML PPV (MISCELLANEOUS) ×5 IMPLANT
CHIPS CANC BONE 40CC CAN1/2 (Bone Implant) ×3 IMPLANT
CLEANER TIP ELECTROSURG 2X2 (MISCELLANEOUS) ×3 IMPLANT
COVER SURGICAL LIGHT HANDLE (MISCELLANEOUS) ×5 IMPLANT
CUFF TOURNIQUET SINGLE 24IN (TOURNIQUET CUFF) ×3 IMPLANT
CUFF TOURNIQUET SINGLE 34IN LL (TOURNIQUET CUFF) ×3 IMPLANT
DRAPE C-ARM 42X72 X-RAY (DRAPES) ×5 IMPLANT
DRAPE C-ARMOR (DRAPES) ×5 IMPLANT
DRAPE HALF SHEET 40X57 (DRAPES) IMPLANT
DRAPE INCISE IOBAN 66X45 STRL (DRAPES) ×5 IMPLANT
DRAPE ORTHO SPLIT 77X108 STRL (DRAPES)
DRAPE SURG ORHT 6 SPLT 77X108 (DRAPES) IMPLANT
DRAPE U-SHAPE 47X51 STRL (DRAPES) ×5 IMPLANT
DRILL BIT 2.5MM (BIT) ×5
DRILL BIT 2.7X100 214235006 DU (MISCELLANEOUS) ×2 IMPLANT
DRSG ADAPTIC 3X8 NADH LF (GAUZE/BANDAGES/DRESSINGS) ×5 IMPLANT
DRSG PAD ABDOMINAL 8X10 ST (GAUZE/BANDAGES/DRESSINGS) ×10 IMPLANT
ELECT REM PT RETURN 9FT ADLT (ELECTROSURGICAL) ×5
ELECTRODE REM PT RTRN 9FT ADLT (ELECTROSURGICAL) ×3 IMPLANT
GAUZE SPONGE 4X4 12PLY STRL (GAUZE/BANDAGES/DRESSINGS) ×5 IMPLANT
GLOVE BIO SURGEON STRL SZ7.5 (GLOVE) ×5 IMPLANT
GLOVE BIO SURGEON STRL SZ8 (GLOVE) ×5 IMPLANT
GLOVE BIOGEL PI IND STRL 7.5 (GLOVE) ×3 IMPLANT
GLOVE BIOGEL PI IND STRL 8 (GLOVE) ×3 IMPLANT
GLOVE BIOGEL PI INDICATOR 7.5 (GLOVE) ×2
GLOVE BIOGEL PI INDICATOR 8 (GLOVE) ×2
GOWN STRL REUS W/ TWL LRG LVL3 (GOWN DISPOSABLE) ×7 IMPLANT
GOWN STRL REUS W/ TWL XL LVL3 (GOWN DISPOSABLE) ×3 IMPLANT
GOWN STRL REUS W/TWL LRG LVL3 (GOWN DISPOSABLE) ×15
GOWN STRL REUS W/TWL XL LVL3 (GOWN DISPOSABLE) ×5
GRAFT BNE CHIP CANC 1-8 40 (Bone Implant) IMPLANT
IMMOBILIZER KNEE 20 (SOFTGOODS) ×5
IMMOBILIZER KNEE 20 THIGH 36 (SOFTGOODS) ×1 IMPLANT
IMMOBILIZER KNEE 22 UNIV (SOFTGOODS) ×5 IMPLANT
KIT BASIN OR (CUSTOM PROCEDURE TRAY) ×5 IMPLANT
KIT INFUSE LRG II (Orthopedic Implant) ×2 IMPLANT
KIT ROOM TURNOVER OR (KITS) ×5 IMPLANT
NDL SUT 6 .5 CRC .975X.05 MAYO (NEEDLE) IMPLANT
NEEDLE MAYO TAPER (NEEDLE)
NS IRRIG 1000ML POUR BTL (IV SOLUTION) ×5 IMPLANT
PACK ORTHO EXTREMITY (CUSTOM PROCEDURE TRAY) ×5 IMPLANT
PAD ABD 8X10 STRL (GAUZE/BANDAGES/DRESSINGS) ×2 IMPLANT
PAD ARMBOARD 7.5X6 YLW CONV (MISCELLANEOUS) ×10 IMPLANT
PAD CAST 4YDX4 CTTN HI CHSV (CAST SUPPLIES) ×3 IMPLANT
PADDING CAST COTTON 4X4 STRL (CAST SUPPLIES) ×5
PADDING CAST COTTON 6X4 STRL (CAST SUPPLIES) ×5 IMPLANT
PLATE LOCK 5H STD LT PROX TIB (Plate) ×2 IMPLANT
SCREW CORTICAL 3.5MM  42MM (Screw) ×2 IMPLANT
SCREW CORTICAL 3.5MM 36MM (Screw) ×4 IMPLANT
SCREW CORTICAL 3.5MM 42MM (Screw) ×1 IMPLANT
SCREW LOCK CORT STAR 3.5X65 (Screw) ×3 IMPLANT
SCREW LOCK CORT STAR 3.5X75 (Screw) ×8 IMPLANT
SCREW LP 3.5X75MM (Screw) ×4 IMPLANT
SPONGE LAP 18X18 X RAY DECT (DISPOSABLE) ×5 IMPLANT
STAPLER VISISTAT 35W (STAPLE) ×5 IMPLANT
STOCKINETTE IMPERVIOUS LG (DRAPES) ×5 IMPLANT
SUCTION FRAZIER HANDLE 10FR (MISCELLANEOUS) ×2
SUCTION TUBE FRAZIER 10FR DISP (MISCELLANEOUS) ×3 IMPLANT
SUT ETHILON 3 0 PS 1 (SUTURE) IMPLANT
SUT PROLENE 0 CT 2 (SUTURE) ×10 IMPLANT
SUT VIC AB 0 CT1 27 (SUTURE) ×5
SUT VIC AB 0 CT1 27XBRD ANBCTR (SUTURE) ×3 IMPLANT
SUT VIC AB 1 CT1 27 (SUTURE) ×5
SUT VIC AB 1 CT1 27XBRD ANBCTR (SUTURE) ×3 IMPLANT
SUT VIC AB 2-0 CT1 27 (SUTURE) ×10
SUT VIC AB 2-0 CT1 TAPERPNT 27 (SUTURE) ×6 IMPLANT
TOWEL OR 17X24 6PK STRL BLUE (TOWEL DISPOSABLE) ×5 IMPLANT
TOWEL OR 17X26 10 PK STRL BLUE (TOWEL DISPOSABLE) ×10 IMPLANT
TRAY FOLEY W/METER SILVER 16FR (SET/KITS/TRAYS/PACK) IMPLANT
TUBE CONNECTING 12'X1/4 (SUCTIONS) ×1
TUBE CONNECTING 12X1/4 (SUCTIONS) ×4 IMPLANT
WATER STERILE IRR 1000ML POUR (IV SOLUTION) ×8 IMPLANT
WIRE K 1.6MM 144256 (MISCELLANEOUS) ×12 IMPLANT
YANKAUER SUCT BULB TIP NO VENT (SUCTIONS) ×5 IMPLANT

## 2016-08-24 NOTE — Anesthesia Procedure Notes (Signed)
Procedure Name: Intubation Date/Time: 08/24/2016 3:40 PM Performed by: Rejeana Brock L Pre-anesthesia Checklist: Patient identified, Emergency Drugs available, Suction available and Patient being monitored Patient Re-evaluated:Patient Re-evaluated prior to induction Oxygen Delivery Method: Circle System Utilized Preoxygenation: Pre-oxygenation with 100% oxygen Induction Type: IV induction Ventilation: Mask ventilation without difficulty Laryngoscope Size: Mac and 4 Grade View: Grade I Tube type: Oral Tube size: 7.5 mm Number of attempts: 1 Airway Equipment and Method: Stylet and Oral airway Placement Confirmation: ETT inserted through vocal cords under direct vision,  positive ETCO2 and breath sounds checked- equal and bilateral Secured at: 22 cm Tube secured with: Tape Dental Injury: Teeth and Oropharynx as per pre-operative assessment

## 2016-08-24 NOTE — Anesthesia Preprocedure Evaluation (Signed)
Anesthesia Evaluation  Patient identified by MRN, date of birth, ID band Patient awake    Reviewed: Allergy & Precautions, NPO status , Patient's Chart, lab work & pertinent test results  Airway Mallampati: II  TM Distance: >3 FB Neck ROM: Full    Dental  (+) Teeth Intact, Dental Advisory Given   Pulmonary Current Smoker,    breath sounds clear to auscultation       Cardiovascular  Rhythm:Regular Rate:Normal     Neuro/Psych    GI/Hepatic   Endo/Other    Renal/GU      Musculoskeletal   Abdominal   Peds  Hematology   Anesthesia Other Findings   Reproductive/Obstetrics                             Anesthesia Physical Anesthesia Plan  ASA: II  Anesthesia Plan: General   Post-op Pain Management:    Induction: Intravenous  PONV Risk Score and Plan: 1 and Dexamethasone and Ondansetron  Airway Management Planned: Oral ETT  Additional Equipment:   Intra-op Plan:   Post-operative Plan: Extubation in OR  Informed Consent: I have reviewed the patients History and Physical, chart, labs and discussed the procedure including the risks, benefits and alternatives for the proposed anesthesia with the patient or authorized representative who has indicated his/her understanding and acceptance.   Dental advisory given  Plan Discussed with: CRNA and Anesthesiologist  Anesthesia Plan Comments:         Anesthesia Quick Evaluation

## 2016-08-24 NOTE — Anesthesia Postprocedure Evaluation (Signed)
Anesthesia Post Note  Patient: Derrick IraniKevin R Lyons  Procedure(s) Performed: Procedure(s) (LRB): OPEN REDUCTION INTERNAL FIXATION (ORIF) TIBIAL PLATEAU (Left) ANTERIOR COMPARTMENT FASCIOTOMY (Left) REPAIR OF MENISCUS (Left)     Patient location during evaluation: PACU Anesthesia Type: General Level of consciousness: awake and alert Pain management: pain level controlled Vital Signs Assessment: post-procedure vital signs reviewed and stable Respiratory status: spontaneous breathing, nonlabored ventilation, respiratory function stable and patient connected to nasal cannula oxygen Cardiovascular status: blood pressure returned to baseline and stable Postop Assessment: no signs of nausea or vomiting Anesthetic complications: no    Last Vitals:  Vitals:   08/24/16 1905 08/24/16 1920  BP: (!) 145/101 (!) 141/92  Pulse: (!) 59 65  Resp: 15 14  Temp:      Last Pain:  Vitals:   08/24/16 1920  PainSc: 10-Worst pain ever                 Lavone Barrientes S

## 2016-08-24 NOTE — Op Note (Signed)
8:58 PM 08/24/2016 08/24/2016  PATIENT:  Derrick Lyons  41 y.o. male  [id number]  PRE-OPERATIVE DIAGNOSIS:   1. LEFT LATERAL TIBIAL PLATEAU FRACTURE 2. SUSPECTED LATERAL MENISCUS TEAR  POST-OPERATIVE DIAGNOSIS:   1. LEFT LATERAL TIBIAL PLATEAU FRACTURE 2. LATERAL MENISCUS TEAR ANTERIOR HORN AND MIDBODY 3. STABLE MCL  PROCEDURE:  Procedure(s): 1. OPEN REDUCTION INTERNAL FIXATION (ORIF) TIBIAL PLATEAU (Left) LATERAL 2. REPAIR OF LATERAL MENISCUS (Left) 3. ANTERIOR COMPARTMENT FASCIOTOMY (Left) 4. STRESS VIEW UNDER FLOURO LEFT KNEE MCL  SURGEON:  Surgeon(s) and Role:    Myrene Galas* Crue Otero, MD - Primary  PHYSICIAN ASSISTANT: 1. Montez MoritaKeith Paul, PA-C; 2. PA Student  ANESTHESIA:   general  I/O:  Total I/O In: -  Out: 400 [Urine:400]  SPECIMEN:  No Specimen  TOURNIQUET:   Total Tourniquet Time Documented: Thigh (Left) - 65 minutes Total: Thigh (Left) - 65 minutes   DICTATION: .Note written in EPIC    BRIEF SUMMARY AND INDICATION FOR PROCEDURE:  Patient is a 41 y.o.-year- old with a tibial plateau fracture, treated provisionally with aggressive ice, elevation, and active motion of the foot and toes to facilitate resolution of soft tissue swelling.  We did discuss with the patient the risks and benefits of surgical treatment including the potential for arthritis, nerve injury, vessel injury, loss of motion, DVT, PE, heart attack, stroke, symptomatic hardware, need for further surgery, and multiple others.  The patient acknowledged these risks and wished to proceed.  BRIEF SUMMARY OF PROCEDURE:  After administration of preoperative antibiotics, the patient was taken to the operating room.  General anesthesia was induced and the lower extremity prepped and draped in usual sterile fashion using a chlorhexidine wash and betadine scrub and paint. A timeout was performed. I then brought in the radiolucent triangle.  A curvilinear incision was made extending laterally over Gerdy's  tubercle. Dissection was carried down where the soft tissues were left intact to the lateral plateau and rim.  I did incise the retinaculum proximal to the tibial plateau, and then going along inside the retinaculum performed a submeniscal arthrotomy , releasing the coronary ligament along its insertion onto the tibia. This revealed complete avulsion of the midbody and anterior horn of the lateral meniscus. The joint was irrigated thoroughly and the joint surface, though severely depressed was relatively smooth and in continuity.  Eight vertical mattress zero prolene suture were used to repair the mensicus back to the capsule.We then released some of the anterior extensors to enable the plate to fit along the proximal shaft. The fracture site was approached through the fracture line in the lateral tibial metaphysis.  Through this, I introduced a series of tamps and with my assistant pulling traction, I was able to elevate the articular surface in sequential fashion while watching it through the arthrotomy.  Once I had restored appropriate height, the bone defect was grafted with Infuse and cancellous chips.  I then placed the plate laterally and used the [employer] clamp to apply a compressive force across the joint line. This reduced the widened plateau back to the appropriate size.  At this point, we placed standard fixation in the proximal row of the plate followed by locked fixation.  The defect in the metaphysis was filled with cancellous bone and tamped into place. This was followed by additional fixation within the shaft. My assistant was careful to control alignment throughout by using traction and bending forces. He also assited with retraction. All wounds were irrigated thoroughly.  Post repair the  C arm was used to assess for MCL integrity with gentle valgus stress force applied.  Prior to closure, I turned my attention to the distal edge of the wound here underneath the skin.  I used  the long scissors to spread both superficial and deep to the anterior compartment.  The fascia was then released for 8 to 10 cm to reduce the likelihood of the postoperative compartment syndrome.  Once more, wound was irrigated and then a standard layered closure performed, 0 Vicryl, 2-0 Vicryl, and 3-0 nylon for the skin.  Sterile gently compressive dressing was applied and in knee immobilizer.  The patient was taken to the PACU in stable condition.  PROGNOSIS: The patient will be transitioned into a hinged knee brace with unrestricted range of motion and this will begin immediately.  He will be nonweightbearing on the operative extremity, be on pharmacologic DVT prophylaxis, and mobilized with PT and OT. After discharge, we will plan to see him back in about 2 weeks for removal of sutures and we will continue to follow throughout the hospital stay.  He is certainly at increased risk of complications from his polysubstance abuse and compliance will likely determine his outcome.The risks of nonsurgical treatment, however, were even greater.    Derrick Lyons. Carola Frost, M.D.
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
## 2016-08-24 NOTE — Transfer of Care (Signed)
Immediate Anesthesia Transfer of Care Note  Patient: [name]  Procedure(s) Performed: Procedure(s): OPEN REDUCTION INTERNAL FIXATION (ORIF) TIBIAL PLATEAU (Left) ANTERIOR COMPARTMENT FASCIOTOMY (Left) REPAIR OF MENISCUS (Left)  Patient Location: PACU  Anesthesia Type:General  Level of Consciousness: awake and alert   Airway & Oxygen Therapy: Patient Spontanous Breathing and Patient connected to nasal cannula oxygen  Post-op Assessment: Report given to RN and Post -op Vital signs reviewed and stable  Post vital signs: Reviewed and stable  Last Vitals:  Vitals:   08/24/16 1037 08/24/16 1835  BP: 119/72   Pulse:    Resp:    Temp:  (P) 36.7 C    Last Pain:  Vitals:   08/24/16 1112  PainSc: 7       Patients Stated Pain Goal: 3 (08/24/16 1112)  Complications: No apparent anesthesia complications

## 2016-08-25 ENCOUNTER — Encounter (HOSPITAL_COMMUNITY): Payer: Self-pay | Admitting: Orthopedic Surgery

## 2016-08-25 DIAGNOSIS — F17200 Nicotine dependence, unspecified, uncomplicated: Secondary | ICD-10-CM | POA: Diagnosis present

## 2016-08-25 DIAGNOSIS — F1910 Other psychoactive substance abuse, uncomplicated: Secondary | ICD-10-CM

## 2016-08-25 HISTORY — DX: Nicotine dependence, unspecified, uncomplicated: F17.200

## 2016-08-25 HISTORY — DX: Other psychoactive substance abuse, uncomplicated: F19.10

## 2016-08-25 LAB — BASIC METABOLIC PANEL
Anion gap: 7 (ref 5–15)
BUN: 5 mg/dL — ABNORMAL LOW (ref 6–20)
CHLORIDE: 101 mmol/L (ref 101–111)
CO2: 27 mmol/L (ref 22–32)
CREATININE: 0.74 mg/dL (ref 0.61–1.24)
Calcium: 8.7 mg/dL — ABNORMAL LOW (ref 8.9–10.3)
GFR calc non Af Amer: >60 mL/min (ref >60)
Glucose, Bld: 135 mg/dL — ABNORMAL HIGH (ref 65–99)
POTASSIUM: 4.3 mmol/L (ref 3.5–5.1)
Sodium: 135 mmol/L (ref 135–145)

## 2016-08-25 LAB — CBC
HEMATOCRIT: 37.6 % — AB (ref 39.0–52.0)
Hemoglobin: 12.6 g/dL — ABNORMAL LOW (ref 13.0–17.0)
MCH: 30.8 pg (ref 26.0–34.0)
MCHC: 33.5 g/dL (ref 30.0–36.0)
MCV: 91.9 fL (ref 78.0–100.0)
PLATELETS: 347 10*3/uL (ref 150–400)
RBC: 4.09 MIL/uL — AB (ref 4.22–5.81)
RDW: 12.6 % (ref 11.5–15.5)
WBC: 11.7 10*3/uL — ABNORMAL HIGH (ref 4.0–10.5)

## 2016-08-25 LAB — TYPE AND SCREEN
ABO/RH(D): O POS
ANTIBODY SCREEN: NEGATIVE

## 2016-08-25 MED ORDER — ENOXAPARIN SODIUM 40 MG/0.4ML ~~LOC~~ SOLN: 40.0000 mg | SUBCUTANEOUS | 0 refills | Status: DC

## 2016-08-25 MED ORDER — DOCUSATE SODIUM 100 MG PO CAPS: 100.0000 mg | ORAL_CAPSULE | Freq: Two times a day (BID) | ORAL | 0 refills | Status: DC

## 2016-08-25 MED ORDER — OXYCODONE-ACETAMINOPHEN 5-325 MG PO TABS: 1.0000 | ORAL_TABLET | Freq: Four times a day (QID) | ORAL | 0 refills | Status: DC | PRN

## 2016-08-25 MED ORDER — ENOXAPARIN (LOVENOX) PATIENT EDUCATION KIT
1.0000 | PACK | Freq: Once | 0 refills | Status: AC
Start: 2016-08-25 — End: 2016-08-25

## 2016-08-25 MED ORDER — METHOCARBAMOL 500 MG PO TABS: 500.0000 mg | ORAL_TABLET | Freq: Four times a day (QID) | ORAL | 0 refills | Status: DC | PRN

## 2016-08-25 MED ORDER — OXYCODONE HCL 5 MG PO TABS: 5.0000 mg | ORAL_TABLET | Freq: Four times a day (QID) | ORAL | 0 refills | Status: DC | PRN
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
## 2016-08-25 NOTE — Discharge Instructions (Signed)
Orthopaedic Trauma Service Discharge Instructions   General Discharge Instructions  WEIGHT BEARING STATUS: Nonweightbearing left leg  RANGE OF MOTION/ACTIVITY: unrestricted range of motion left knee and ankle  Wound Care: daily dressing changes starting on 08/27/2016. See instructions below  Discharge Wound Care Instructions  Do NOT apply any ointments, solutions or lotions to pin sites or surgical wounds.  These prevent needed drainage and even though solutions like hydrogen peroxide kill bacteria, they also damage cells lining the pin sites that help fight infection.  Applying lotions or ointments can keep the wounds moist and can cause them to breakdown and open up as well. This can increase the risk for infection. When in doubt call the office.  Surgical incisions should be dressed daily.  If any drainage is noted, use one layer of adaptic, then gauze, Kerlix, and an ace wrap.  Once the incision is completely dry and without drainage, it may be left open to air out.  Showering may begin 36-48 hours later.  Cleaning gently with soap and water.  Traumatic wounds should be dressed daily as well.    One layer of adaptic, gauze, Kerlix, then ace wrap.  The adaptic can be discontinued once the draining has ceased    If you have a wet to dry dressing: wet the gauze with saline the squeeze as much saline out so the gauze is moist (not soaking wet), place moistened gauze over wound, then place a dry gauze over the moist one, followed by Kerlix wrap, then ace wrap.    PAIN MEDICATION USE AND EXPECTATIONS  You have likely been given narcotic medications to help control your pain.  After a traumatic event that results in an fracture (broken bone) with or without surgery, it is ok to use narcotic pain medications to help control one's pain.  We understand that everyone responds to pain differently and each individual patient will be evaluated on a regular basis for the continued need for narcotic  medications. Ideally, narcotic medication use should last no more than 6-8 weeks (coinciding with fracture healing).   As a patient it is your responsibility as well to monitor narcotic medication use and report the amount and frequency you use these medications when you come to your office visit.   We would also advise that if you are using narcotic medications, you should take a dose prior to therapy to maximize you participation.  IF YOU ARE ON NARCOTIC MEDICATIONS IT IS NOT PERMISSIBLE TO OPERATE A MOTOR VEHICLE (MOTORCYCLE/CAR/TRUCK/MOPED) OR HEAVY MACHINERY DO NOT MIX NARCOTICS WITH OTHER CNS (CENTRAL NERVOUS SYSTEM) DEPRESSANTS SUCH AS ALCOHOL  Diet: as you were eating previously.  Can use over the counter stool softeners and bowel preparations, such as Miralax, to help with bowel movements.  Narcotics can be constipating.  Be sure to drink plenty of fluids    STOP SMOKING OR USING NICOTINE PRODUCTS!!!!  As discussed nicotine severely impairs your body's ability to heal surgical and traumatic wounds but also impairs bone healing.  Wounds and bone heal by forming microscopic blood vessels (angiogenesis) and nicotine is a vasoconstrictor (essentially, shrinks blood vessels).  Therefore, if vasoconstriction occurs to these microscopic blood vessels they essentially disappear and are unable to deliver necessary nutrients to the healing tissue.  This is one modifiable factor that you can do to dramatically increase your chances of healing your injury.    (This means no smoking, no nicotine gum, patches, etc)  DO NOT USE NONSTEROIDAL ANTI-INFLAMMATORY DRUGS (NSAID'S)  Using products  such as Advil (ibuprofen), Aleve (naproxen), Motrin (ibuprofen) for additional pain control during fracture healing can delay and/or prevent the healing response.  If you would like to take over the counter (OTC) medication, Tylenol (acetaminophen) is ok.  However, some narcotic medications that are given for pain  control contain acetaminophen as well. Therefore, you should not exceed more than 4000 mg of tylenol in a day if you do not have liver disease.  Also note that there are may OTC medicines, such as cold medicines and allergy medicines that my contain tylenol as well.  If you have any questions about medications and/or interactions please ask your doctor/PA or your pharmacist.      ICE AND ELEVATE INJURED/OPERATIVE EXTREMITY  Using ice and elevating the injured extremity above your heart can help with swelling and pain control.  Icing in a pulsatile fashion, such as 20 minutes on and 20 minutes off, can be followed.    Do not place ice directly on skin. Make sure there is a barrier between to skin and the ice pack.    Using frozen items such as frozen peas works well as the conform nicely to the are that needs to be iced.  USE AN ACE WRAP OR TED HOSE FOR SWELLING CONTROL  In addition to icing and elevation, Ace wraps or TED hose are used to help limit and resolve swelling.  It is recommended to use Ace wraps or TED hose until you are informed to stop.    When using Ace Wraps start the wrapping distally (farthest away from the body) and wrap proximally (closer to the body)   Example: If you had surgery on your leg or thing and you do not have a splint on, start the ace wrap at the toes and work your way up to the thigh        If you had surgery on your upper extremity and do not have a splint on, start the ace wrap at your fingers and work your way up to the upper arm  IF YOU ARE IN A SPLINT OR CAST DO NOT [place]   If your splint gets wet for any reason please contact the office immediately. You may shower in your splint or cast as long as you keep it dry.  This can be done by wrapping in a cast cover or garbage back (or similar)  Do Not stick any thing down your splint or cast such as pencils, money, or hangers to try and scratch yourself with.  If you feel itchy take benadryl as  prescribed on the bottle for itching  IF YOU ARE IN A CAM BOOT (BLACK BOOT)  You may remove boot periodically. Perform daily dressing changes as noted below.  Wash the liner of the boot regularly and wear a sock when wearing the boot. It is recommended that you sleep in the boot until told otherwise  CALL THE OFFICE WITH ANY QUESTIONS OR CONCERNS: [phone]

## 2016-08-25 NOTE — Progress Notes (Signed)
Orthopedic Tech Progress Note Patient Details:  Derrick LentKevin R Palen Oct 23, 1975 [id number]  Patient ID: Derrick LentKevin R Lyons, male   DOB: Oct 23, 1975, 41 y.o.   MRN: [id number]   Nikki DomCrawford, Mylani Gentry 08/25/2016, 9:25 AM Called in advanced brace order; spoke with Juanda ChanceKanisha

## 2016-08-25 NOTE — Progress Notes (Signed)
Orthopedic Tech Progress Note Patient Details:  Darryl LentKevin R Gladue 10-Jun-1975 [id number]  Ortho Devices Type of Ortho Device:  (footsie roll) Ortho Device/Splint Location: trapeze bar patient helper Ortho Device/Splint Interventions: Application Murlean Callerlle  Nikitia Asbill 08/25/2016, 8:35 AM
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
## 2016-08-25 NOTE — Progress Notes (Signed)
Spoken to patient several times and explained the importance of orthotic immobilizer to stay in place, patient states that he has wanted to move leg and has been careful when removing.
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
## 2016-08-26 LAB — VITAMIN D 25 HYDROXY (VIT D DEFICIENCY, FRACTURES): Vit D, 25-Hydroxy: 30.5 ng/mL (ref 30.0–100.0)

## 2016-08-28 ENCOUNTER — Encounter (HOSPITAL_COMMUNITY): Payer: Self-pay

## 2016-08-28 ENCOUNTER — Emergency Department (HOSPITAL_COMMUNITY)
Admission: EM | Admit: 2016-08-28 | Discharge: 2016-08-28 | Disposition: A | Discharge status: Home / Self Care | Payer: Self-pay | Attending: Emergency Medicine | Admitting: Emergency Medicine

## 2016-08-28 DIAGNOSIS — J45909 Unspecified asthma, uncomplicated: Secondary | ICD-10-CM | POA: Insufficient documentation

## 2016-08-28 DIAGNOSIS — Z4889 Encounter for other specified surgical aftercare: Secondary | ICD-10-CM | POA: Insufficient documentation

## 2016-08-28 DIAGNOSIS — M7989 Other specified soft tissue disorders: Secondary | ICD-10-CM | POA: Insufficient documentation

## 2016-08-28 DIAGNOSIS — F17210 Nicotine dependence, cigarettes, uncomplicated: Secondary | ICD-10-CM | POA: Insufficient documentation

## 2016-08-28 MED ORDER — OXYCODONE-ACETAMINOPHEN 5-325 MG PO TABS
1.0000 | ORAL_TABLET | Freq: Four times a day (QID) | ORAL | 0 refills | Status: DC | PRN
Start: 2016-08-28 — End: 2020-11-13

## 2016-08-28 MED ORDER — CEPHALEXIN 500 MG PO CAPS
500.0000 mg | ORAL_CAPSULE | Freq: Once | ORAL | Status: AC
  Administered 2016-08-28: 500 mg via ORAL
  Filled 2016-08-28: qty 1

## 2016-08-28 MED ORDER — CEPHALEXIN 500 MG PO CAPS: 500.0000 mg | ORAL_CAPSULE | Freq: Four times a day (QID) | ORAL | 0 refills | Status: DC

## 2016-08-28 MED ORDER — OXYCODONE-ACETAMINOPHEN 5-325 MG PO TABS
2.0000 | ORAL_TABLET | Freq: Once | ORAL | Status: AC
  Administered 2016-08-28: 2 via ORAL
  Filled 2016-08-28: qty 2

## 2016-08-28 NOTE — ED Notes (Signed)
ED Provider at bedside. 

## 2016-08-28 NOTE — ED Triage Notes (Signed)
Had surgery on left knee by Dr. Carola FrostHandy Thursday. Reports of left leg swelling/pain.

## 2016-08-28 NOTE — Discharge Instructions (Signed)
Swelling of your leg, should improve with elevation of the entire left leg, with it straight, as much as possible, above your heart.  We are starting a prescription for antibiotic, in case there is an early wound infection.

## 2016-08-28 NOTE — ED Notes (Signed)
Pt not in waiting area 
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
## 2016-08-28 NOTE — ED Notes (Signed)
Pt ambulatory (with crutches) to bathroom and back to room

## 2016-10-30 ENCOUNTER — Emergency Department (HOSPITAL_COMMUNITY)
Admission: EM | Admit: 2016-10-30 | Discharge: 2016-10-30 | Disposition: A | Discharge status: Home / Self Care | Payer: Self-pay | Attending: Emergency Medicine | Admitting: Emergency Medicine

## 2016-10-30 ENCOUNTER — Encounter (HOSPITAL_COMMUNITY): Payer: Self-pay | Admitting: Emergency Medicine

## 2016-10-30 DIAGNOSIS — Y929 Unspecified place or not applicable: Secondary | ICD-10-CM | POA: Insufficient documentation

## 2016-10-30 DIAGNOSIS — Y999 Unspecified external cause status: Secondary | ICD-10-CM | POA: Insufficient documentation

## 2016-10-30 DIAGNOSIS — T18120A Food in esophagus causing compression of trachea, initial encounter: Secondary | ICD-10-CM | POA: Insufficient documentation

## 2016-10-30 DIAGNOSIS — Z5321 Procedure and treatment not carried out due to patient leaving prior to being seen by health care provider: Secondary | ICD-10-CM | POA: Insufficient documentation

## 2016-10-30 DIAGNOSIS — X58XXXA Exposure to other specified factors, initial encounter: Secondary | ICD-10-CM | POA: Insufficient documentation

## 2016-10-30 DIAGNOSIS — Y939 Activity, unspecified: Secondary | ICD-10-CM | POA: Insufficient documentation

## 2016-10-30 NOTE — ED Notes (Signed)
During triage pt states the piece of food went down. Pt is currently drinking water at this time and states he is not staying.

## 2016-10-30 NOTE — ED Triage Notes (Signed)
Pt states he has tenderloin stuck in his throat x 30 minutes. Pt states he cannot get saliva down.

## 2018-03-31 ENCOUNTER — Emergency Department (HOSPITAL_COMMUNITY)
Admission: EM | Admit: 2018-03-31 | Discharge: 2018-03-31 | Disposition: A | Discharge status: Home / Self Care | Payer: Self-pay | Attending: Emergency Medicine | Admitting: Emergency Medicine

## 2018-03-31 ENCOUNTER — Other Ambulatory Visit: Payer: Self-pay

## 2018-03-31 ENCOUNTER — Emergency Department (HOSPITAL_COMMUNITY): Payer: Self-pay

## 2018-03-31 ENCOUNTER — Encounter (HOSPITAL_COMMUNITY): Payer: Self-pay | Admitting: *Deleted

## 2018-03-31 DIAGNOSIS — E079 Disorder of thyroid, unspecified: Secondary | ICD-10-CM | POA: Insufficient documentation

## 2018-03-31 DIAGNOSIS — M25562 Pain in left knee: Secondary | ICD-10-CM | POA: Insufficient documentation

## 2018-03-31 DIAGNOSIS — F17210 Nicotine dependence, cigarettes, uncomplicated: Secondary | ICD-10-CM | POA: Insufficient documentation

## 2018-03-31 DIAGNOSIS — J45909 Unspecified asthma, uncomplicated: Secondary | ICD-10-CM | POA: Insufficient documentation

## 2018-03-31 MED ORDER — METHOCARBAMOL 500 MG PO TABS: 500.0000 mg | ORAL_TABLET | Freq: Four times a day (QID) | ORAL | 0 refills | Status: DC | PRN

## 2018-03-31 MED ORDER — IBUPROFEN 400 MG PO TABS
400.0000 mg | ORAL_TABLET | Freq: Once | ORAL | Status: AC
  Administered 2018-03-31: 400 mg via ORAL
  Filled 2018-03-31: qty 1

## 2018-03-31 MED ORDER — HYDROCODONE-ACETAMINOPHEN 5-325 MG PO TABS
1.0000 | ORAL_TABLET | Freq: Once | ORAL | Status: AC
  Administered 2018-03-31: 1 via ORAL
  Filled 2018-03-31: qty 1
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
## 2018-03-31 NOTE — Discharge Instructions (Addendum)
Your x-ray is stable.  Wear the knee sleeve and follow-up with Dr. Carola Frost.  As we discussed she may need an MRI to evaluate for ligament damage in your knee.  Return to the ED with worsening pain, fever, weakness, numbness or any other concerns.

## 2018-03-31 NOTE — ED Triage Notes (Signed)
Pt c/o left knee pain after falling on it; pt has plates and screws in that leg already

## 2018-05-10 ENCOUNTER — Encounter (HOSPITAL_COMMUNITY): Payer: Self-pay

## 2018-05-10 ENCOUNTER — Emergency Department (HOSPITAL_COMMUNITY)
Admission: EM | Admit: 2018-05-10 | Discharge: 2018-05-10 | Disposition: A | Discharge status: Home / Self Care | Payer: Self-pay | Attending: Emergency Medicine | Admitting: Emergency Medicine

## 2018-05-10 ENCOUNTER — Other Ambulatory Visit: Payer: Self-pay

## 2018-05-10 DIAGNOSIS — J45909 Unspecified asthma, uncomplicated: Secondary | ICD-10-CM | POA: Insufficient documentation

## 2018-05-10 DIAGNOSIS — L0201 Cutaneous abscess of face: Secondary | ICD-10-CM | POA: Insufficient documentation

## 2018-05-10 DIAGNOSIS — Z79899 Other long term (current) drug therapy: Secondary | ICD-10-CM | POA: Insufficient documentation

## 2018-05-10 DIAGNOSIS — F17210 Nicotine dependence, cigarettes, uncomplicated: Secondary | ICD-10-CM | POA: Insufficient documentation

## 2018-05-10 MED ORDER — LIDOCAINE HCL (PF) 1 % IJ SOLN
5.0000 mL | Freq: Once | INTRAMUSCULAR | Status: AC
Start: 2018-05-10 — End: 2018-05-10
  Administered 2018-05-10: 5 mL
  Filled 2018-05-10: qty 6

## 2018-05-10 MED ORDER — DOXYCYCLINE HYCLATE 100 MG PO TABS
100.0000 mg | ORAL_TABLET | Freq: Once | ORAL | Status: AC
  Administered 2018-05-10: 100 mg via ORAL
  Filled 2018-05-10: qty 1

## 2018-05-10 MED ORDER — CLINDAMYCIN HCL 150 MG PO CAPS: 150.0000 mg | ORAL_CAPSULE | Freq: Four times a day (QID) | ORAL | 0 refills | Status: DC

## 2018-05-10 MED ORDER — TRAMADOL HCL 50 MG PO TABS
100.0000 mg | ORAL_TABLET | Freq: Once | ORAL | Status: AC
  Administered 2018-05-10: 100 mg via ORAL
  Filled 2018-05-10: qty 2

## 2018-05-10 MED ORDER — POVIDONE-IODINE 10 % EX SOLN
CUTANEOUS | Status: AC
  Administered 2018-05-10: 09:00:00
  Filled 2018-05-10: qty 15

## 2018-05-10 MED ORDER — ONDANSETRON HCL 4 MG PO TABS
4.0000 mg | ORAL_TABLET | Freq: Once | ORAL | Status: AC
  Administered 2018-05-10: 4 mg via ORAL
  Filled 2018-05-10: qty 1

## 2018-05-10 NOTE — ED Notes (Signed)
Pt left with friend driving 

## 2018-05-10 NOTE — Discharge Instructions (Addendum)
Your vital signs are within normal limits.  Your oxygen level is 98% on room air.  Within normal limits by my interpretation.  Today you received an incision and drainage of the abscess of your left facial cheek.  Please apply warm compress to this area daily.  Use a Band-Aid over the site keep dirt and germs out until it heals.  Use to clindamycin tablets 2 times daily with food.  Use 600 mg of ibuprofen with breakfast, lunch, dinner, and at bedtime for soreness.  See your primary physician or return to the emergency department if any red streaking of your face, or signs of advancing infection.

## 2018-05-10 NOTE — ED Notes (Signed)
Pt changing into gown to be examined.
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
## 2018-05-10 NOTE — ED Triage Notes (Signed)
Pt reports abscess to left cheek x 1 week.

## 2018-05-13 LAB — AEROBIC CULTURE  (SUPERFICIAL SPECIMEN)

## 2018-05-13 LAB — AEROBIC CULTURE W GRAM STAIN (SUPERFICIAL SPECIMEN): Special Requests: NORMAL

## 2018-05-14 ENCOUNTER — Telehealth: Payer: Self-pay

## 2018-05-14 NOTE — Telephone Encounter (Signed)
Post ED Visit - Positive Culture Follow-up  Culture report reviewed by antimicrobial stewardship pharmacist: [facility] []  Enzo Bi, Pharm.D. []  Celedonio Miyamoto, Pharm.D., BCPS AQ-ID []  Garvin Fila, Pharm.D., BCPS []  Georgina Pillion, [address].D., BCPS []  [place], [address].D., BCPS, AAHIVP []  Estella Husk, Pharm.D., BCPS, AAHIVP [x]  Lysle Pearl, PharmD, BCPS []  Phillips Climes, PharmD, BCPS []  Agapito Games, PharmD, BCPS []  Verlan Friends, PharmD []  Mervyn Gay, PharmD, BCPS []  Vinnie Level, PharmD  [facility] []  Len Childs, PharmD []  Greer Pickerel, PharmD []  Adalberto Cole, PharmD []  Perlie Gold, Rph []  Lonell Face) Jean Rosenthal, PharmD []  Earl Many, PharmD []  Junita Push, PharmD []  Dorna Leitz, PharmD []  Terrilee Files, PharmD []  Lynann Beaver, PharmD []  Keturah Barre, PharmD []  Loralee Pacas, PharmD []  Bernadene Person, PharmD   Positive aerobic culture Treated with Clindamycin, organism sensitive to the same and no further patient follow-up is required at this time.  Jerry Caras 05/14/2018, 7:42 AM

## 2018-12-03 ENCOUNTER — Other Ambulatory Visit: Payer: Self-pay | Admitting: *Deleted

## 2018-12-03 DIAGNOSIS — Z20822 Contact with and (suspected) exposure to covid-19: Secondary | ICD-10-CM

## 2018-12-04 LAB — NOVEL CORONAVIRUS, NAA: SARS-CoV-2, NAA: NOT DETECTED

## 2018-12-10 ENCOUNTER — Telehealth: Payer: Self-pay | Admitting: General Practice

## 2018-12-10 NOTE — Telephone Encounter (Signed)
Negative COVID results given. Patient results "NOT Detected." Caller expressed understanding. ° °

## 2019-01-27 ENCOUNTER — Emergency Department (HOSPITAL_COMMUNITY)
Admission: EM | Admit: 2019-01-27 | Discharge: 2019-01-28 | Discharge status: Against Medical Advice | Payer: Self-pay | Attending: Emergency Medicine | Admitting: Emergency Medicine

## 2019-01-27 ENCOUNTER — Encounter (HOSPITAL_COMMUNITY): Payer: Self-pay | Admitting: Emergency Medicine

## 2019-01-27 ENCOUNTER — Other Ambulatory Visit: Payer: Self-pay

## 2019-01-27 DIAGNOSIS — J45909 Unspecified asthma, uncomplicated: Secondary | ICD-10-CM | POA: Insufficient documentation

## 2019-01-27 DIAGNOSIS — Z5320 Procedure and treatment not carried out because of patient's decision for unspecified reasons: Secondary | ICD-10-CM | POA: Insufficient documentation

## 2019-01-27 DIAGNOSIS — F17210 Nicotine dependence, cigarettes, uncomplicated: Secondary | ICD-10-CM | POA: Insufficient documentation

## 2019-01-27 DIAGNOSIS — R112 Nausea with vomiting, unspecified: Secondary | ICD-10-CM | POA: Insufficient documentation

## 2019-01-27 DIAGNOSIS — Z20828 Contact with and (suspected) exposure to other viral communicable diseases: Secondary | ICD-10-CM | POA: Insufficient documentation

## 2019-01-27 DIAGNOSIS — R197 Diarrhea, unspecified: Secondary | ICD-10-CM | POA: Insufficient documentation

## 2019-01-27 LAB — URINALYSIS, ROUTINE W REFLEX MICROSCOPIC
Bilirubin Urine: NEGATIVE
Glucose, UA: NEGATIVE mg/dL
Hgb urine dipstick: NEGATIVE
Ketones, ur: NEGATIVE mg/dL
Leukocytes,Ua: NEGATIVE
Nitrite: NEGATIVE
Protein, ur: NEGATIVE mg/dL
Specific Gravity, Urine: 1.009 (ref 1.005–1.030)
pH: 6 (ref 5.0–8.0)

## 2019-01-27 LAB — COMPREHENSIVE METABOLIC PANEL
ALT: 14 U/L (ref 0–44)
AST: 20 U/L (ref 15–41)
Albumin: 4.4 g/dL (ref 3.5–5.0)
Alkaline Phosphatase: 55 U/L (ref 38–126)
Anion gap: 9 (ref 5–15)
BUN: 11 mg/dL (ref 6–20)
CO2: 26 mmol/L (ref 22–32)
Calcium: 9.1 mg/dL (ref 8.9–10.3)
Chloride: 99 mmol/L (ref 98–111)
Creatinine, Ser: 0.89 mg/dL (ref 0.61–1.24)
GFR calc Af Amer: >60 mL/min (ref >60)
GFR calc non Af Amer: >60 mL/min (ref >60)
Glucose, Bld: 85 mg/dL (ref 70–99)
Potassium: 3.7 mmol/L (ref 3.5–5.1)
Sodium: 134 mmol/L — ABNORMAL LOW (ref 135–145)
Total Bilirubin: 0.8 mg/dL (ref 0.3–1.2)
Total Protein: 7.2 g/dL (ref 6.5–8.1)

## 2019-01-27 LAB — CBC
HCT: 45.5 % (ref 39.0–52.0)
Hemoglobin: 15.4 g/dL (ref 13.0–17.0)
MCH: 31.2 pg (ref 26.0–34.0)
MCHC: 33.8 g/dL (ref 30.0–36.0)
MCV: 92.3 fL (ref 80.0–100.0)
Platelets: 249 10*3/uL (ref 150–400)
RBC: 4.93 MIL/uL (ref 4.22–5.81)
RDW: 11.8 % (ref 11.5–15.5)
WBC: 7.2 10*3/uL (ref 4.0–10.5)
nRBC: 0 % (ref 0.0–0.2)

## 2019-01-27 LAB — LIPASE, BLOOD: Lipase: 23 U/L (ref 11–51)

## 2019-01-27 MED ORDER — ONDANSETRON 4 MG PO TBDP
4.0000 mg | ORAL_TABLET | Freq: Once | ORAL | Status: AC
  Administered 2019-01-27: 4 mg via ORAL
  Filled 2019-01-27: qty 1

## 2019-01-27 NOTE — ED Triage Notes (Signed)
Pt states he has had n/v/d all day today with runny nose and body aches.

## 2019-01-28 MED ORDER — PROMETHAZINE HCL 25 MG PO TABS: 25.0000 mg | ORAL_TABLET | Freq: Four times a day (QID) | ORAL | 0 refills | Status: DC | PRN

## 2019-01-28 NOTE — ED Notes (Signed)
Pt left department without notifying staff.  
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
## 2020-02-21 ENCOUNTER — Encounter (HOSPITAL_COMMUNITY): Payer: Self-pay | Admitting: Emergency Medicine

## 2020-02-21 ENCOUNTER — Other Ambulatory Visit: Payer: Self-pay

## 2020-02-21 ENCOUNTER — Emergency Department (HOSPITAL_COMMUNITY)
Admission: EM | Admit: 2020-02-21 | Discharge: 2020-02-21 | Disposition: A | Discharge status: Home / Self Care | Payer: Self-pay | Attending: Emergency Medicine | Admitting: Emergency Medicine

## 2020-02-21 DIAGNOSIS — L0231 Cutaneous abscess of buttock: Secondary | ICD-10-CM

## 2020-02-21 DIAGNOSIS — J45909 Unspecified asthma, uncomplicated: Secondary | ICD-10-CM | POA: Insufficient documentation

## 2020-02-21 DIAGNOSIS — L0291 Cutaneous abscess, unspecified: Secondary | ICD-10-CM | POA: Insufficient documentation

## 2020-02-21 DIAGNOSIS — E079 Disorder of thyroid, unspecified: Secondary | ICD-10-CM | POA: Insufficient documentation

## 2020-02-21 DIAGNOSIS — F17210 Nicotine dependence, cigarettes, uncomplicated: Secondary | ICD-10-CM | POA: Insufficient documentation

## 2020-02-21 MED ORDER — MUPIROCIN CALCIUM 2 % EX CREA: 1.0000 "application " | TOPICAL_CREAM | Freq: Two times a day (BID) | CUTANEOUS | 0 refills | Status: DC

## 2020-02-21 NOTE — ED Triage Notes (Addendum)
Patient c/o possible abscess x3 to right buttocks that appeared 5-6 days ago and getting progressively worse. Patient does report purulent drainage. Denies any fevers. Per patient red and tender to touch.
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
## 2020-02-21 NOTE — Discharge Instructions (Signed)
You were seen in the emergency department for painful scabbing on the right buttock  These are likely healing boils or scabbing from picking or itching the skin  Apply warm/hot wet rag to the area at least 3 times daily.  Alternate ibuprofen and acetaminophen as needed for pain.  Apply over-the-counter triple antibacterial ointment or prescribed mupirocin cream.  Try not to pick or remove the scabs as this can cause a worsening infection or delayed healing or scarring  Return to the ED for increased pain, swelling, pus, fevers

## 2020-04-08 ENCOUNTER — Other Ambulatory Visit: Payer: Self-pay

## 2020-04-08 ENCOUNTER — Ambulatory Visit: Payer: Medicaid Other | Admitting: Nurse Practitioner

## 2020-07-11 ENCOUNTER — Emergency Department (HOSPITAL_COMMUNITY)
Admission: EM | Admit: 2020-07-11 | Discharge: 2020-07-11 | Disposition: A | Discharge status: Home / Self Care | Payer: Medicaid Other | Attending: Emergency Medicine | Admitting: Emergency Medicine

## 2020-07-11 ENCOUNTER — Other Ambulatory Visit: Payer: Self-pay

## 2020-07-11 ENCOUNTER — Encounter (HOSPITAL_COMMUNITY): Payer: Self-pay | Admitting: Emergency Medicine

## 2020-07-11 DIAGNOSIS — F17210 Nicotine dependence, cigarettes, uncomplicated: Secondary | ICD-10-CM | POA: Insufficient documentation

## 2020-07-11 DIAGNOSIS — J45909 Unspecified asthma, uncomplicated: Secondary | ICD-10-CM | POA: Insufficient documentation

## 2020-07-11 DIAGNOSIS — K0889 Other specified disorders of teeth and supporting structures: Secondary | ICD-10-CM | POA: Insufficient documentation

## 2020-07-11 MED ORDER — PENICILLIN V POTASSIUM 500 MG PO TABS: 500.0000 mg | ORAL_TABLET | Freq: Four times a day (QID) | ORAL | 0 refills | Status: AC

## 2020-07-11 MED ORDER — PENICILLIN V POTASSIUM 250 MG PO TABS
500.0000 mg | ORAL_TABLET | Freq: Once | ORAL | Status: AC
  Administered 2020-07-11: 500 mg via ORAL
  Filled 2020-07-11: qty 2

## 2020-07-11 NOTE — ED Triage Notes (Signed)
Pt c/o dental pain and infection on the right side for the past 3 days.

## 2020-07-11 NOTE — Discharge Instructions (Addendum)
I suspect your pain is from a dental infection.  I start you on antibiotics.  Please take as prescribed.  I recommend over-the-counter pain medication like ibuprofen and or Tylenol every 6 hours as needed please follow dosing on the back of bottle.  You need to follow-up with a dentist for further evaluation given you information above please call to schedule a follow-up appointment.  Come back to the emergency department if you develop chest pain, shortness of breath, severe abdominal pain, uncontrolled nausea, vomiting, diarrhea.
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
## 2020-08-17 ENCOUNTER — Other Ambulatory Visit: Payer: Self-pay

## 2020-08-17 ENCOUNTER — Emergency Department (HOSPITAL_COMMUNITY)
Admission: EM | Admit: 2020-08-17 | Discharge: 2020-08-17 | Disposition: A | Discharge status: Home / Self Care | Payer: Medicaid Other | Attending: Emergency Medicine | Admitting: Emergency Medicine

## 2020-08-17 ENCOUNTER — Encounter (HOSPITAL_COMMUNITY): Payer: Self-pay | Admitting: *Deleted

## 2020-08-17 DIAGNOSIS — L03116 Cellulitis of left lower limb: Secondary | ICD-10-CM

## 2020-08-17 DIAGNOSIS — J45909 Unspecified asthma, uncomplicated: Secondary | ICD-10-CM | POA: Insufficient documentation

## 2020-08-17 DIAGNOSIS — F17210 Nicotine dependence, cigarettes, uncomplicated: Secondary | ICD-10-CM | POA: Insufficient documentation

## 2020-08-17 MED ORDER — DOXYCYCLINE HYCLATE 100 MG PO CAPS: 100.0000 mg | ORAL_CAPSULE | Freq: Two times a day (BID) | ORAL | 0 refills | Status: DC

## 2020-08-17 MED ORDER — DOXYCYCLINE HYCLATE 100 MG PO TABS
100.0000 mg | ORAL_TABLET | Freq: Once | ORAL | Status: AC
  Administered 2020-08-17: 100 mg via ORAL
  Filled 2020-08-17: qty 1

## 2020-08-17 MED ORDER — MELOXICAM 7.5 MG PO TABS: 7.5000 mg | ORAL_TABLET | Freq: Two times a day (BID) | ORAL | 0 refills | Status: AC | PRN
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
## 2020-08-17 NOTE — Discharge Instructions (Addendum)
See your doctor in 2 days for recheck of your infection  Doxycycline 100mg  by mouth twice daily until the medicine is completely finished - this medicine is a strong antibiotic that treats certain infections.    Please take Mobic - 7.5mg  by mouth twice daily as needed for pain - this in an antiinflammatory medicine (NSAID) and is similar to ibuprofen - many people feel that it is stronger than ibuprofen and it is easier to take since it is a smaller pill.  Please use this only for 1 week - if your pain persists, you will need to follow up with your doctor in the office for ongoing guidance and pain control.    Thank you for letting take care of you today!  Please obtain all of your results from medical records or have your doctors office obtain the results - share them with your doctor - you should be seen at your doctors office in the next 2 days. Call today to arrange your follow up. Take the medications as prescribed. Please review all of the medicines and only take them if you do not have an allergy to them. Please be aware that if you are taking birth control pills, taking other prescriptions, ESPECIALLY ANTIBIOTICS may make the birth control ineffective - if this is the case, either do not engage in sexual activity or use alternative methods of birth control such as condoms until you have finished the medicine and your family doctor says it is OK to restart them. If you are on a blood thinner such as COUMADIN, be aware that any other medicine that you take may cause the coumadin to either work too much, or not enough - you should have your coumadin level rechecked in next 7 days if this is the case.  ?  It is also a possibility that you have an allergic reaction to any of the medicines that you have been prescribed - Everybody reacts differently to medications and while MOST people have no trouble with most medicines, you may have a reaction such as nausea, vomiting, rash, swelling, shortness of  breath. If this is the case, please stop taking the medicine immediately and contact your physician.   If you were given a medication in the ED such as percocet, vicodin, or morphine, be aware that these medicines are sedating and may change your ability to take care of yourself adequately for several hours after being given this medicines - you should not drive or take care of small children if you were given this medicine in the Emergency Department or if you have been prescribed these types of medicines. ?   You should return to the ER IMMEDIATELY if you develop severe or worsening symptoms.

## 2020-08-17 NOTE — ED Triage Notes (Signed)
Left leg infected onset 2 days ago

## 2020-09-06 ENCOUNTER — Other Ambulatory Visit: Payer: Self-pay

## 2020-09-06 ENCOUNTER — Emergency Department (HOSPITAL_COMMUNITY)
Admission: EM | Admit: 2020-09-06 | Discharge: 2020-09-06 | Disposition: A | Discharge status: Home / Self Care | Payer: Medicaid Other | Attending: Emergency Medicine | Admitting: Emergency Medicine

## 2020-09-06 ENCOUNTER — Encounter (HOSPITAL_COMMUNITY): Payer: Self-pay

## 2020-09-06 DIAGNOSIS — J45909 Unspecified asthma, uncomplicated: Secondary | ICD-10-CM | POA: Insufficient documentation

## 2020-09-06 DIAGNOSIS — F17210 Nicotine dependence, cigarettes, uncomplicated: Secondary | ICD-10-CM | POA: Insufficient documentation

## 2020-09-06 DIAGNOSIS — K047 Periapical abscess without sinus: Secondary | ICD-10-CM | POA: Insufficient documentation

## 2020-09-06 MED ORDER — LIDOCAINE-EPINEPHRINE (PF) 2 %-1:200000 IJ SOLN
10.0000 mL | Freq: Once | INTRAMUSCULAR | Status: AC
  Administered 2020-09-06: 10 mL
  Filled 2020-09-06: qty 20

## 2020-09-06 MED ORDER — IBUPROFEN 400 MG PO TABS
400.0000 mg | ORAL_TABLET | Freq: Once | ORAL | Status: AC
  Administered 2020-09-06: 400 mg via ORAL
  Filled 2020-09-06: qty 1

## 2020-09-06 MED ORDER — OXYCODONE-ACETAMINOPHEN 5-325 MG PO TABS
1.0000 | ORAL_TABLET | Freq: Once | ORAL | Status: AC
  Administered 2020-09-06: 1 via ORAL
  Filled 2020-09-06: qty 1

## 2020-09-06 MED ORDER — PENICILLIN V POTASSIUM 500 MG PO TABS: 500.0000 mg | ORAL_TABLET | Freq: Four times a day (QID) | ORAL | 0 refills | Status: AC

## 2020-09-06 NOTE — ED Triage Notes (Signed)
Pt presents to ED with complaints of right sided facial swelling x 3 days, denies fever. Pt states he has had a bad tooth on the right lower side for a while. Pt states "the infection feels like it is in my cheek."
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
## 2020-09-06 NOTE — Discharge Instructions (Addendum)
Please finish your antibiotics.  Limit smoking.  Warm salt water gargles.  Tylenol and ibuprofen as needed for pain.  Contact dentist for follow-up.  Return to emergency department for any worsening or concerning symptoms.

## 2020-11-13 ENCOUNTER — Emergency Department (HOSPITAL_COMMUNITY)
Admission: EM | Admit: 2020-11-13 | Discharge: 2020-11-13 | Disposition: A | Discharge status: Home / Self Care | Payer: Medicaid Other | Attending: Emergency Medicine | Admitting: Emergency Medicine

## 2020-11-13 ENCOUNTER — Other Ambulatory Visit: Payer: Self-pay

## 2020-11-13 ENCOUNTER — Emergency Department (HOSPITAL_COMMUNITY): Payer: Medicaid Other

## 2020-11-13 ENCOUNTER — Encounter (HOSPITAL_COMMUNITY): Payer: Self-pay

## 2020-11-13 DIAGNOSIS — J45909 Unspecified asthma, uncomplicated: Secondary | ICD-10-CM | POA: Insufficient documentation

## 2020-11-13 DIAGNOSIS — R112 Nausea with vomiting, unspecified: Secondary | ICD-10-CM | POA: Insufficient documentation

## 2020-11-13 DIAGNOSIS — W01198A Fall on same level from slipping, tripping and stumbling with subsequent striking against other object, initial encounter: Secondary | ICD-10-CM | POA: Insufficient documentation

## 2020-11-13 DIAGNOSIS — Y9389 Activity, other specified: Secondary | ICD-10-CM | POA: Insufficient documentation

## 2020-11-13 DIAGNOSIS — S50311A Abrasion of right elbow, initial encounter: Secondary | ICD-10-CM | POA: Insufficient documentation

## 2020-11-13 DIAGNOSIS — M7021 Olecranon bursitis, right elbow: Secondary | ICD-10-CM

## 2020-11-13 DIAGNOSIS — L089 Local infection of the skin and subcutaneous tissue, unspecified: Secondary | ICD-10-CM

## 2020-11-13 DIAGNOSIS — F17210 Nicotine dependence, cigarettes, uncomplicated: Secondary | ICD-10-CM | POA: Insufficient documentation

## 2020-11-13 MED ORDER — DOXYCYCLINE HYCLATE 100 MG PO CAPS: 100.0000 mg | ORAL_CAPSULE | Freq: Two times a day (BID) | ORAL | 0 refills | Status: DC

## 2020-11-13 MED ORDER — HYDROCODONE-ACETAMINOPHEN 5-325 MG PO TABS: ORAL_TABLET | ORAL | 0 refills | Status: DC

## 2020-11-13 MED ORDER — MUPIROCIN CALCIUM 2 % NA OINT: TOPICAL_OINTMENT | NASAL | 0 refills | Status: DC

## 2020-11-13 NOTE — Discharge Instructions (Addendum)
Take the antibiotic as directed until its finished.  Avoid excessive use of the right arm.  Return here on Monday for recheck.  Return sooner if you develop fever, chills, or red streaking of the elbow.

## 2020-11-13 NOTE — ED Triage Notes (Signed)
Pt. Arrived via POV.  Pt. Right elbow is red and swollen.  Pt. States they hit their elbow a week ago and than they threw up the next day and felt ill for 24 hours. Pt states they keep having reoccurring infections throughout their body.
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
## 2022-04-24 ENCOUNTER — Ambulatory Visit: Payer: Medicaid Other | Admitting: Internal Medicine

## 2022-04-25 ENCOUNTER — Ambulatory Visit: Payer: Medicaid Other | Admitting: Internal Medicine

## 2022-08-09 ENCOUNTER — Emergency Department (HOSPITAL_COMMUNITY): Payer: Medicaid Other

## 2022-08-09 ENCOUNTER — Encounter (HOSPITAL_COMMUNITY): Payer: Self-pay | Admitting: Emergency Medicine

## 2022-08-09 ENCOUNTER — Emergency Department (HOSPITAL_COMMUNITY)
Admission: EM | Admit: 2022-08-09 | Discharge: 2022-08-09 | Disposition: A | Discharge status: Home / Self Care | Payer: Self-pay | Attending: Emergency Medicine | Admitting: Emergency Medicine

## 2022-08-09 ENCOUNTER — Other Ambulatory Visit: Payer: Self-pay

## 2022-08-09 DIAGNOSIS — Y92410 Unspecified street and highway as the place of occurrence of the external cause: Secondary | ICD-10-CM | POA: Diagnosis not present

## 2022-08-09 DIAGNOSIS — R4189 Other symptoms and signs involving cognitive functions and awareness: Secondary | ICD-10-CM | POA: Insufficient documentation

## 2022-08-09 DIAGNOSIS — M542 Cervicalgia: Secondary | ICD-10-CM | POA: Insufficient documentation

## 2022-08-09 DIAGNOSIS — M79631 Pain in right forearm: Secondary | ICD-10-CM | POA: Insufficient documentation

## 2022-08-09 DIAGNOSIS — S0990XA Unspecified injury of head, initial encounter: Secondary | ICD-10-CM | POA: Insufficient documentation

## 2022-08-09 MED ORDER — HYDROCODONE-ACETAMINOPHEN 5-325 MG PO TABS
1.0000 | ORAL_TABLET | Freq: Once | ORAL | Status: AC
  Administered 2022-08-09: 1 via ORAL
  Filled 2022-08-09: qty 1
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
## 2022-08-09 NOTE — Discharge Instructions (Addendum)
The workup in the emergency room is reassuring.  There is no evidence of any brain bleed, cervical spine injury.  For your chronic memory issues, we have given you a phone number for the neurologist in town.  Consider calling them for additional checkup.

## 2022-08-15 ENCOUNTER — Emergency Department (HOSPITAL_COMMUNITY): Payer: MEDICAID | Admitting: Anesthesiology

## 2022-08-15 ENCOUNTER — Other Ambulatory Visit: Payer: Self-pay

## 2022-08-15 ENCOUNTER — Emergency Department (HOSPITAL_COMMUNITY): Payer: MEDICAID

## 2022-08-15 ENCOUNTER — Encounter (HOSPITAL_COMMUNITY): Payer: Self-pay | Admitting: *Deleted

## 2022-08-15 ENCOUNTER — Ambulatory Visit (HOSPITAL_COMMUNITY)
Admission: EM | Admit: 2022-08-15 | Discharge: 2022-08-15 | Disposition: A | Discharge status: Home / Self Care | Payer: MEDICAID | Attending: Emergency Medicine | Admitting: Emergency Medicine

## 2022-08-15 ENCOUNTER — Encounter (HOSPITAL_COMMUNITY)
Admission: EM | Disposition: A | Discharge status: Home / Self Care | Payer: Self-pay | Source: Home / Self Care | Attending: Emergency Medicine

## 2022-08-15 DIAGNOSIS — T18108A Unspecified foreign body in esophagus causing other injury, initial encounter: Secondary | ICD-10-CM

## 2022-08-15 DIAGNOSIS — T18128A Food in esophagus causing other injury, initial encounter: Secondary | ICD-10-CM | POA: Diagnosis present

## 2022-08-15 DIAGNOSIS — F1490 Cocaine use, unspecified, uncomplicated: Secondary | ICD-10-CM | POA: Diagnosis not present

## 2022-08-15 DIAGNOSIS — R634 Abnormal weight loss: Secondary | ICD-10-CM | POA: Insufficient documentation

## 2022-08-15 DIAGNOSIS — J45909 Unspecified asthma, uncomplicated: Secondary | ICD-10-CM | POA: Diagnosis not present

## 2022-08-15 DIAGNOSIS — F1911 Other psychoactive substance abuse, in remission: Secondary | ICD-10-CM | POA: Insufficient documentation

## 2022-08-15 DIAGNOSIS — E079 Disorder of thyroid, unspecified: Secondary | ICD-10-CM | POA: Diagnosis not present

## 2022-08-15 DIAGNOSIS — Z681 Body mass index (BMI) 19 or less, adult: Secondary | ICD-10-CM | POA: Diagnosis not present

## 2022-08-15 DIAGNOSIS — G40909 Epilepsy, unspecified, not intractable, without status epilepticus: Secondary | ICD-10-CM | POA: Diagnosis not present

## 2022-08-15 DIAGNOSIS — W44F3XA Food entering into or through a natural orifice, initial encounter: Secondary | ICD-10-CM

## 2022-08-15 DIAGNOSIS — F17210 Nicotine dependence, cigarettes, uncomplicated: Secondary | ICD-10-CM | POA: Diagnosis not present

## 2022-08-15 DIAGNOSIS — R1310 Dysphagia, unspecified: Secondary | ICD-10-CM

## 2022-08-15 HISTORY — PX: ESOPHAGOGASTRODUODENOSCOPY (EGD) WITH PROPOFOL: SHX5813

## 2022-08-15 HISTORY — PX: BIOPSY: SHX5522

## 2022-08-15 HISTORY — PX: IMPACTION REMOVAL: SHX5858

## 2022-08-15 LAB — BASIC METABOLIC PANEL
Anion gap: 8 (ref 5–15)
BUN: 13 mg/dL (ref 6–20)
CO2: 26 mmol/L (ref 22–32)
Calcium: 8.6 mg/dL — ABNORMAL LOW (ref 8.9–10.3)
Chloride: 103 mmol/L (ref 98–111)
Creatinine, Ser: 0.94 mg/dL (ref 0.61–1.24)
GFR, Estimated: >60 mL/min (ref >60)
Glucose, Bld: 78 mg/dL (ref 70–99)
Potassium: 3.7 mmol/L (ref 3.5–5.1)
Sodium: 137 mmol/L (ref 135–145)

## 2022-08-15 LAB — CBC
HCT: 41.2 % (ref 39.0–52.0)
Hemoglobin: 14.1 g/dL (ref 13.0–17.0)
MCH: 31.4 pg (ref 26.0–34.0)
MCHC: 34.2 g/dL (ref 30.0–36.0)
MCV: 91.8 fL (ref 80.0–100.0)
Platelets: 229 10*3/uL (ref 150–400)
RBC: 4.49 MIL/uL (ref 4.22–5.81)
RDW: 11.8 % (ref 11.5–15.5)
WBC: 6.8 10*3/uL (ref 4.0–10.5)
nRBC: 0 % (ref 0.0–0.2)

## 2022-08-15 SURGERY — ESOPHAGOGASTRODUODENOSCOPY (EGD) WITH PROPOFOL
Anesthesia: General

## 2022-08-15 MED ORDER — SUCCINYLCHOLINE CHLORIDE 20 MG/ML IJ SOLN
INTRAMUSCULAR | Status: DC | PRN
  Administered 2022-08-15: 100 mg via INTRAVENOUS

## 2022-08-15 MED ORDER — GLUCAGON HCL RDNA (DIAGNOSTIC) 1 MG IJ SOLR
1.0000 mg | Freq: Once | INTRAMUSCULAR | Status: AC
  Administered 2022-08-15: 1 mg via INTRAVENOUS
  Filled 2022-08-15: qty 1

## 2022-08-15 MED ORDER — OMEPRAZOLE 40 MG PO CPDR: 40.0000 mg | DELAYED_RELEASE_CAPSULE | Freq: Two times a day (BID) | ORAL | 0 refills | Status: DC

## 2022-08-15 MED ORDER — DIAZEPAM 5 MG/ML IJ SOLN
2.5000 mg | Freq: Once | INTRAMUSCULAR | Status: AC
  Administered 2022-08-15: 2.5 mg via INTRAVENOUS
  Filled 2022-08-15: qty 2

## 2022-08-15 MED ORDER — LACTATED RINGERS IV SOLN: INTRAVENOUS | Status: DC | PRN

## 2022-08-15 MED ORDER — ONDANSETRON HCL 4 MG/2ML IJ SOLN
4.0000 mg | Freq: Once | INTRAMUSCULAR | Status: AC
  Administered 2022-08-15: 4 mg via INTRAVENOUS
  Filled 2022-08-15: qty 2

## 2022-08-15 MED ORDER — SODIUM CHLORIDE 0.9 % IV SOLN: INTRAVENOUS | Status: DC

## 2022-08-15 MED ORDER — PROPOFOL 10 MG/ML IV BOLUS
INTRAVENOUS | Status: DC | PRN
  Administered 2022-08-15 (×3): 100 mg via INTRAVENOUS

## 2022-08-15 MED ORDER — MORPHINE SULFATE (PF) 4 MG/ML IV SOLN: 4.0000 mg | Freq: Once | INTRAVENOUS | Status: DC

## 2022-08-15 NOTE — Op Note (Signed)
[facility] Patient Name: Derrick Lyons Procedure Date: 08/15/2022 3:48 PM MRN: [id number] Date of Birth: 1975/03/13 Attending MD: Katrinka Blazing , , [id number] CSN: [id number] Age: 47 Admit Type: Outpatient Procedure:                Upper GI endoscopy Indications:              Foreign body in the esophagus Providers:                Katrinka Blazing, Crystal Page, Dyann Ruddle Referring MD:              Medicines:                General Anesthesia Complications:            No immediate complications. Estimated Blood Loss:     Estimated blood loss: none. Procedure:                Pre-Anesthesia Assessment:                           - Prior to the procedure, a History and Physical                            was performed, and patient medications, allergies                            and sensitivities were reviewed. The patient's                            tolerance of previous anesthesia was reviewed.                           - The risks and benefits of the procedure and the                            sedation options and risks were discussed with the                            patient. All questions were answered and informed                            consent was obtained.                           After obtaining informed consent, the endoscope was                            passed under direct vision. Throughout the                            procedure, the patient's blood pressure, pulse, and                            oxygen saturations were monitored continuously. The                            [id number]-[id number] ([id number])  scope was introduced through the                            mouth, and advanced to the second part of duodenum.                            The upper GI endoscopy was accomplished without                            difficulty. The patient tolerated the procedure                            well. Scope In: 4:03:58 PM Scope Out: 4:18:11 PM Total Procedure Duration:  0 hours 14 minutes 13 seconds  Findings:      Food was found in the entire esophagus. Removal was accomplished with a       banding device and rat-toothed forceps.      The examined esophagus was normal. Biopsies were obtained from the       proximal and distal esophagus with cold forceps for histology of       suspected eosinophilic esophagitis.      The entire examined stomach was normal.      The examined duodenum was normal. Impression:               - Food in the esophagus. Removal was successful.                           - Normal esophagus.                           - Normal stomach.                           - Normal examined duodenum.                           - Biopsies were taken with a cold forceps for                            evaluation of eosinophilic esophagitis. Moderate Sedation:      Per Anesthesia Care Recommendation:           - Discharge patient to home (ambulatory).                           - Resume previous diet.                           - Await pathology results.                           - Use Prilosec (omeprazole) 40 mg PO BID.                           - Return to GI clinic in 4 weeks. Procedure Code(s):        --- Professional ---  40981, Esophagogastroduodenoscopy, flexible,                            transoral; with removal of foreign body(s)                           43239, Esophagogastroduodenoscopy, flexible,                            transoral; with biopsy, single or multiple Diagnosis Code(s):        --- Professional ---                           X91.478G, Food in esophagus causing other injury,                            initial encounter                           T18.108A, Unspecified foreign body in esophagus                            causing other injury, initial encounter CPT copyright 2022 [employer]. All rights reserved. The codes documented in this report are preliminary and upon coder review  may  be revised to meet current compliance requirements. Katrinka Blazing, MD Katrinka Blazing,  08/15/2022 4:27:47 PM This report has been signed electronically. Number of Addenda: 0

## 2022-08-15 NOTE — Transfer of Care (Signed)
Immediate Anesthesia Transfer of Care Note  Patient: Derrick Lyons  Procedure(s) Performed: ESOPHAGOGASTRODUODENOSCOPY (EGD) WITH PROPOFOL IMPACTION REMOVAL BIOPSY  Patient Location: PACU  Anesthesia Type:General  Level of Consciousness: drowsy  Airway & Oxygen Therapy: Patient Spontanous Breathing  Post-op Assessment: Report given to RN and Post -op Vital signs reviewed and stable  Post vital signs: Reviewed and stable  Last Vitals:  Vitals Value Taken Time  BP 117/72 08/15/22 1622  Temp 98   Pulse 69 08/15/22 1624  Resp 19 08/15/22 1625  SpO2 100 % 08/15/22 1624  Vitals shown include unvalidated device data.  Last Pain:  Vitals:   08/15/22 1557  TempSrc:   PainSc: 6          Complications: No notable events documented.

## 2022-08-15 NOTE — Anesthesia Preprocedure Evaluation (Signed)
Anesthesia Evaluation  Patient identified by MRN, date of birth, ID band Patient awake    Reviewed: Allergy & Precautions, H&P , NPO status , Patient's Chart, lab work & pertinent test results, reviewed documented beta blocker date and time   Airway Mallampati: II  TM Distance: >3 FB Neck ROM: full    Dental no notable dental hx.    Pulmonary neg pulmonary ROS, asthma , Current Smoker   Pulmonary exam normal breath sounds clear to auscultation       Cardiovascular Exercise Tolerance: Good negative cardio ROS  Rhythm:regular Rate:Normal     Neuro/Psych Seizures -,  negative neurological ROS  negative psych ROS   GI/Hepatic negative GI ROS,,,(+)     substance abuse  cocaine use  Endo/Other  negative endocrine ROS    Renal/GU negative Renal ROS  negative genitourinary   Musculoskeletal   Abdominal   Peds  Hematology negative hematology ROS (+)   Anesthesia Other Findings Cocaine use yesterday - must proceed due to emergency. Will plan TIVA first GETA if food impaction not quickly resolved.   Reproductive/Obstetrics negative OB ROS                             Anesthesia Physical Anesthesia Plan  ASA: 4 and emergent  Anesthesia Plan: General and General ETT   Post-op Pain Management:    Induction:   PONV Risk Score and Plan: Propofol infusion  Airway Management Planned:   Additional Equipment:   Intra-op Plan:   Post-operative Plan:   Informed Consent: I have reviewed the patients History and Physical, chart, labs and discussed the procedure including the risks, benefits and alternatives for the proposed anesthesia with the patient or authorized representative who has indicated his/her understanding and acceptance.     Dental Advisory Given  Plan Discussed with: CRNA  Anesthesia Plan Comments:        Anesthesia Quick Evaluation

## 2022-08-15 NOTE — Discharge Instructions (Signed)
You are being discharged to home.  Resume your previous diet.  We are waiting for your pathology results.  Take Prilosec (omeprazole) 40 mg by mouth twice a day.  Return to your GI clinic in four weeks.

## 2022-08-15 NOTE — Anesthesia Procedure Notes (Signed)
Procedure Name: Intubation Date/Time: 08/15/2022 4:06 PM  Performed by: Windell Norfolk, MDPre-anesthesia Checklist: Patient identified, Emergency Drugs available, Suction available and Patient being monitored Patient Re-evaluated:Patient Re-evaluated prior to induction Oxygen Delivery Method: Circle system utilized Preoxygenation: Pre-oxygenation with 100% oxygen Induction Type: IV induction Ventilation: Mask ventilation without difficulty Laryngoscope Size: Mac and 3 Grade View: Grade II Tube type: Oral Number of attempts: 1 Airway Equipment and Method: Stylet and Oral airway Placement Confirmation: ETT inserted through vocal cords under direct vision, positive ETCO2 and breath sounds checked- equal and bilateral Tube secured with: Tape Dental Injury: Teeth and Oropharynx as per pre-operative assessment

## 2022-08-15 NOTE — Anesthesia Postprocedure Evaluation (Signed)
Anesthesia Post Note  Patient: Derrick Lyons  Procedure(s) Performed: ESOPHAGOGASTRODUODENOSCOPY (EGD) WITH PROPOFOL IMPACTION REMOVAL BIOPSY  Patient location during evaluation: PACU Anesthesia Type: General Level of consciousness: awake and alert Pain management: pain level controlled Vital Signs Assessment: post-procedure vital signs reviewed and stable Respiratory status: spontaneous breathing, nonlabored ventilation, respiratory function stable and patient connected to nasal cannula oxygen Cardiovascular status: blood pressure returned to baseline and stable Postop Assessment: no apparent nausea or vomiting Anesthetic complications: no   No notable events documented.   Last Vitals:  Vitals:   08/15/22 1415 08/15/22 1622  BP: 114/87 117/72  Pulse: (!) 47 76  Resp: 15 (!) 21  Temp: 36.5 C   SpO2: 100% 100%    Last Pain:  Vitals:   08/15/22 1557  TempSrc:   PainSc: 6                  Windell Norfolk
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
## 2022-08-15 NOTE — ED Provider Notes (Signed)
Derrick Lyons AT [facility] Provider Note   CSN: [id number] Arrival date & time: 08/15/22  1145     History  Chief Complaint  Patient presents with   Swallowed Foreign Body    Derrick Lyons is a 47 y.o. male.  47 year old male with a history of polysubstance abuse, tobacco use, and asthma who presents to the emergency [facility]ons with dysphagia.  For the past month and a half he has had some difficulty with swallowing intermittently getting food caught in his throat.  Says that today he was eating some steak at noon and it got caught in his throat he is now having difficulty swallowing his saliva.  No difficulty breathing.  Has had 10 to 15 pound weight loss in the past month and a half as well but he believes this is due to mental health reasons as he has not been eating much until recently.  Denies any SI or HI at this time.  No history of cancer.  No history of endoscopy in the past.       Home Medications Prior to Admission medications   Medication Sig Start Date End Date Taking? Authorizing Provider  acetaminophen (TYLENOL) 500 MG tablet Take 500 mg by mouth every 6 (six) hours as needed for mild pain or moderate pain.    [provider]  citalopram (CELEXA) 20 MG tablet Take 20 mg by mouth daily. 06/03/20   [provider]  doxycycline (VIBRAMYCIN) 100 MG capsule Take 1 capsule (100 mg total) by mouth 2 (two) times daily. 11/13/20   Triplett, Tammy, PA-C  HYDROcodone-acetaminophen (NORCO/VICODIN) 5-325 MG tablet Take one tab po q 4 hrs prn pain 11/13/20   Triplett, Tammy, PA-C  mupirocin nasal ointment (BACTROBAN) 2 % Apply to the affected areas TID x 10 days 11/13/20   Triplett, Tammy, PA-C  promethazine (PHENERGAN) 25 MG tablet Take 1 tablet (25 mg total) by mouth every 6 (six) hours as needed for nausea or vomiting. 01/28/19   Idol, Raynelle Fanning, PA-C  traZODone (DESYREL) 100 MG tablet Take 100 mg by mouth at bedtime. 06/03/20   [provider]      Allergies    Naproxen    Review of Systems   Review of Systems  Physical Exam Updated Vital Signs BP 103/72   Pulse 61   Temp 97.8 F (36.6 C) (Oral)   Resp 18   SpO2 98%  Physical Exam Vitals and nursing note reviewed.  Constitutional:      General: He is not in acute distress.    Appearance: He is well-developed.     Comments: Frequently spitting up and having difficulty tolerating his secretions.  HENT:     Head: Normocephalic and atraumatic.     Right Ear: External ear normal.     Left Ear: External ear normal.     Nose: Nose normal.  Eyes:     Extraocular Movements: Extraocular movements intact.     Conjunctiva/sclera: Conjunctivae normal.     Pupils: Pupils are equal, round, and reactive to light.  Cardiovascular:     Rate and Rhythm: Normal rate and regular rhythm.     Heart sounds: Normal heart sounds.  Pulmonary:     Effort: Pulmonary effort is normal. No respiratory distress.     Breath sounds: Normal breath sounds.  Abdominal:     Palpations: Abdomen is soft.  Musculoskeletal:     Cervical back: Normal range of motion and neck supple.  Skin:  General: Skin is warm and dry.  Neurological:     Mental Status: He is alert. Mental status is at baseline.  Psychiatric:        Mood and Affect: Mood normal.        Behavior: Behavior normal.     ED Results / Procedures / Treatments   Labs (all labs ordered are listed, but only abnormal results are displayed) Labs Reviewed  BASIC METABOLIC PANEL - Abnormal; Notable for the following components:      Result Value   Calcium 8.6 (*)    All other components within normal limits  CBC    EKG None  Radiology DG Neck Soft Tissue  Result Date: 08/15/2022 CLINICAL DATA:  Difficulty swallowing after eating steak and a piece got caught in his throat. EXAM: NECK SOFT TISSUES - 1+ VIEW COMPARISON:  Cervical spine CT 08/09/2022 FINDINGS: No radiopaque foreign body is seen. The prevertebral  soft tissues are unremarkable. The epiglottis is normal. The airway is patent. There is no acute osseous abnormality. The imaged lung apices are clear. IMPRESSION: No radiopaque foreign body or other acute abnormality identified. Electronically Signed   By: Lesia Hausen M.D.   On: 08/15/2022 13:08    Procedures Procedures    Medications Ordered in ED Medications  morphine (PF) 4 MG/ML injection 4 mg ( Intravenous MAR Hold 08/15/22 1408)  0.9 %  sodium chloride infusion (has no administration in time range)  glucagon (human recombinant) (GLUCAGEN) injection 1 mg (1 mg Intravenous Given 08/15/22 1301)  ondansetron (ZOFRAN) injection 4 mg (4 mg Intravenous Given 08/15/22 1302)  diazepam (VALIUM) injection 2.5 mg (2.5 mg Intravenous Given 08/15/22 1302)    ED Course/ Medical Decision Making/ A&P Clinical Course as of 08/15/22 1419  Tue Aug 15, 2022  1416 Dr Job Founds from GI will take back for endoscopy today.  Did discuss with him the weight loss that the patient has been having.  They will likely discharge him after endoscopy. [RP]    Clinical Course User Index [RP] Rondel Baton, MD                             Medical Decision Making Amount and/or Complexity of Data Reviewed Labs: ordered. Radiology: ordered.  Risk Prescription drug management.   Derrick Lyons is a 47 y.o. male with comorbidities that complicate the patient evaluation including history of polysubstance abuse, tobacco use, and asthma who presents to the emergency [facility]ons with dysphagia and difficulty tolerating his secretions  Initial Ddx:  Food impaction, diverticulum, malignancy  MDM/Course:  Concern about possible food impaction given the patient's symptoms.  May have a structural abnormality such as diverticulum or malignancy even given his tobacco use and weight loss recently.  Did give the patient Zofran, glucagon, and pain medication but was still unable to tolerate his secretions.  Did not have any  concerns for airway compromise.  Was discussed with GI who will take back for endoscopy at this point in time.  Upon re-evaluation was still having difficulty tolerating his secretions.  They plan on discharging him after endoscopy if successful.  This patient presents to the ED for concern of complaints listed in HPI, this involves an extensive number of treatment options, and is a complaint that carries with it a high risk of complications and morbidity. Disposition including potential need for admission considered.   Dispo: Endoscopy suite  Records reviewed Outpatient Clinic Notes The following labs  were independently interpreted: Chemistry and show no acute abnormality I independently reviewed the following imaging with scope of interpretation limited to determining acute life threatening conditions related to emergency care:  X-ray soft tissue  and agree with the radiologist interpretation with the following exceptions: Does show radiopaque area in the pharynx which could be impacted food I personally reviewed and interpreted cardiac monitoring: normal sinus rhythm  I personally reviewed and interpreted the pt's EKG: see above for interpretation  I have reviewed the patients home medications and made adjustments as needed Consults: Gastroenterology        Final Clinical Impression(s) / ED Diagnoses Final diagnoses:  Food impaction of esophagus, initial encounter  Dysphagia, unspecified type    Rx / DC Orders ED Discharge Orders     None         Rondel Baton, MD 08/15/22 1419

## 2022-08-15 NOTE — ED Triage Notes (Signed)
Pt states he was eating some steak and a piece got caught in his throat and he states he is having difficulty with swallowing

## 2022-08-18 LAB — SURGICAL PATHOLOGY

## 2022-08-21 ENCOUNTER — Encounter (INDEPENDENT_AMBULATORY_CARE_PROVIDER_SITE_OTHER): Payer: Self-pay | Admitting: *Deleted

## 2022-08-22 ENCOUNTER — Encounter (HOSPITAL_COMMUNITY): Payer: Self-pay | Admitting: Gastroenterology

## 2022-09-13 ENCOUNTER — Encounter: Payer: Self-pay | Admitting: Gastroenterology

## 2022-09-13 ENCOUNTER — Ambulatory Visit (INDEPENDENT_AMBULATORY_CARE_PROVIDER_SITE_OTHER): Payer: MEDICAID | Admitting: Gastroenterology

## 2022-09-13 VITALS — BP 123/76 | HR 67 | Temp 97.9°F | Ht 69.0 in | Wt 107.2 lb

## 2022-09-13 DIAGNOSIS — R1310 Dysphagia, unspecified: Secondary | ICD-10-CM | POA: Diagnosis not present

## 2022-09-13 MED ORDER — NYSTATIN 100000 UNIT/ML MT SUSP: 5.0000 mL | Freq: Four times a day (QID) | OROMUCOSAL | 1 refills | Status: DC

## 2022-09-13 MED ORDER — PANTOPRAZOLE SODIUM 40 MG PO TBEC
40.0000 mg | DELAYED_RELEASE_TABLET | Freq: Every day | ORAL | 3 refills | Status: DC
Start: 2022-09-13 — End: 2022-09-14

## 2022-09-13 NOTE — Patient Instructions (Signed)
I have sent in pantoprazole to take once daily, 30 minutes before breakfast.  I also sent in nystatin solution. Take this 4 times a day. You will swish it around in your mouth and hold as long as possible, then swallow.  We are arranging an upper endoscopy with dilation in the near future.   We are also ordering an xray of your esophagus to assess for any motility issues going on.  Further recommendations to follow!  It was a pleasure to see you today. I want to create trusting relationships with patients and provide genuine, compassionate, and quality care. I truly value your feedback, so please be on the lookout for a survey regarding your visit with me today. I appreciate your time in completing this!         Gelene Mink, PhD, ANP-BC [facility]
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
## 2022-09-14 ENCOUNTER — Encounter (INDEPENDENT_AMBULATORY_CARE_PROVIDER_SITE_OTHER): Payer: Self-pay

## 2022-09-14 ENCOUNTER — Telehealth (INDEPENDENT_AMBULATORY_CARE_PROVIDER_SITE_OTHER): Payer: Self-pay | Admitting: Gastroenterology

## 2022-09-14 ENCOUNTER — Other Ambulatory Visit (INDEPENDENT_AMBULATORY_CARE_PROVIDER_SITE_OTHER): Payer: Self-pay | Admitting: Gastroenterology

## 2022-09-14 DIAGNOSIS — R1310 Dysphagia, unspecified: Secondary | ICD-10-CM

## 2022-09-14 DIAGNOSIS — F1910 Other psychoactive substance abuse, uncomplicated: Secondary | ICD-10-CM

## 2022-09-14 MED ORDER — PANTOPRAZOLE SODIUM 40 MG PO TBEC
40.0000 mg | DELAYED_RELEASE_TABLET | Freq: Two times a day (BID) | ORAL | 3 refills | Status: DC
Start: 2022-09-14 — End: 2024-01-07

## 2022-09-14 NOTE — Addendum Note (Signed)
Addended by: Dolores Frame on: 09/14/2022 12:48 PM   Modules accepted: Orders

## 2022-09-14 NOTE — Telephone Encounter (Signed)
Pt contacted. Pt has been rescheduled for 09/26/22 at 8:00am. Lab order placed. Updated instructions sent to pt via mail.   Dolores Frame, MD  Marlowe Shores, LPN; Ahmed, Juanetta Beets, MD Hi, He can be done in room 1, but he needs to have a urine toxicology test given cocaine abuse. If positive, we will not be able to schedule him. Thanks       Previous Messages    ----- Message ----- From: Marlowe Shores, LPN Sent: 1/61/0960  11:16 AM EDT To: Dolores Frame, MD; *  Good morning! This pt saw Tobi Bastos today and is needing an EGD with dilation rather soon per Tobi Bastos. Dr.Castandeda has no room 3 availability for August but Dr.Ahmed has openings. Dr.Castaneda are you OK with Dr.Ahmed doing procedures if they need them done soon? Thank you!!

## 2022-09-19 ENCOUNTER — Telehealth (INDEPENDENT_AMBULATORY_CARE_PROVIDER_SITE_OTHER): Payer: Self-pay | Admitting: Gastroenterology

## 2022-09-19 NOTE — Telephone Encounter (Signed)
Pt contacted with pre op information

## 2022-09-22 ENCOUNTER — Encounter (HOSPITAL_COMMUNITY)
Admission: RE | Admit: 2022-09-22 | Discharge: 2022-09-22 | Disposition: A | Discharge status: Home / Self Care | Payer: MEDICAID | Source: Ambulatory Visit | Attending: Gastroenterology

## 2022-09-25 ENCOUNTER — Telehealth: Payer: Self-pay | Admitting: *Deleted

## 2022-09-25 NOTE — Pre-Procedure Instructions (Signed)
Attempted pre-op phone call. Left VM for him to call us back. 

## 2022-09-25 NOTE — Pre-Procedure Instructions (Signed)
Office messaged to let them know that we have left messages for patient to call back and we have not heard back from him.

## 2022-09-25 NOTE — Pre-Procedure Instructions (Signed)
Office messaged to let them know that patient admitted to cocaine use yesterday and that he needs to be rescheduled.

## 2022-09-25 NOTE — Progress Notes (Signed)
Pt called back after numerous attempts to contact for Pre op phone call for procedure on 09/26/22. In questioning, pt admitted to having done cocaine yesterday, 09/24/22. Pt has an interest in "detox" in the near future. Pt said that he would call the office to reschedule.

## 2022-09-25 NOTE — Telephone Encounter (Signed)
Thanks for the update. He needs to reach [place] to schedule his endoscopy as he has a history of poor compliance.  He also needs a urine toxicology test prior to any procedure given history of cocaine use.

## 2022-09-25 NOTE — Telephone Encounter (Signed)
Elsie Amis, RN  Estudillo, Ewell Poe, CMA; Elinor Dodge, LPN; Marlowe Shores, LPN Hey there! I have left multiple messaged for Camry Faler to call us back about his procedure and we have not heard back from him. Just wanted you to know.   Elsie Amis, RN  Estudillo, Ewell Poe, CMA; Elinor Dodge, LPN; Marlowe Shores, LPN Hey. Thoams just called back and needs to be rescheduled. He did cocaine yesterday. I  let Eber Jones know to put him in the depot

## 2022-09-26 ENCOUNTER — Ambulatory Visit (HOSPITAL_COMMUNITY): Admission: RE | Admit: 2022-09-26 | Payer: MEDICAID | Source: Home / Self Care | Admitting: Gastroenterology

## 2022-09-26 ENCOUNTER — Encounter (HOSPITAL_COMMUNITY): Admission: RE | Payer: Self-pay | Source: Home / Self Care

## 2022-09-26 SURGERY — ESOPHAGOGASTRODUODENOSCOPY (EGD) WITH PROPOFOL
Anesthesia: Monitor Anesthesia Care

## 2022-09-27 NOTE — Telephone Encounter (Signed)
Noted  

## 2022-10-09 ENCOUNTER — Other Ambulatory Visit: Payer: Self-pay

## 2022-10-09 ENCOUNTER — Encounter (HOSPITAL_COMMUNITY): Payer: Self-pay | Admitting: Emergency Medicine

## 2022-10-09 ENCOUNTER — Emergency Department (HOSPITAL_COMMUNITY)
Admission: EM | Admit: 2022-10-09 | Discharge: 2022-10-09 | Disposition: A | Discharge status: Home / Self Care | Payer: MEDICAID | Attending: Emergency Medicine | Admitting: Emergency Medicine

## 2022-10-09 DIAGNOSIS — L739 Follicular disorder, unspecified: Secondary | ICD-10-CM | POA: Insufficient documentation

## 2022-10-09 DIAGNOSIS — M7918 Myalgia, other site: Secondary | ICD-10-CM | POA: Diagnosis present

## 2022-10-09 MED ORDER — DOXYCYCLINE HYCLATE 100 MG PO TABS
100.0000 mg | ORAL_TABLET | Freq: Once | ORAL | Status: AC
  Administered 2022-10-09: 100 mg via ORAL
  Filled 2022-10-09: qty 1

## 2022-10-09 MED ORDER — DOXYCYCLINE HYCLATE 100 MG PO CAPS: 100.0000 mg | ORAL_CAPSULE | Freq: Two times a day (BID) | ORAL | 0 refills | Status: DC

## 2022-10-09 NOTE — ED Notes (Signed)
Pt had open weeping sores to his right ear, wrist, and arm that he admits to picking. Pt states that they are painful. They are red and open. No swelling noted.

## 2022-10-09 NOTE — ED Provider Notes (Signed)
EMERGENCY DEPARTMENT AT [facility] Provider Note   CSN: [id number] Arrival date & time: 10/09/22  4098     History  Chief Complaint  Patient presents with   Sores to body    Derrick Lyons is a 47 y.o. male.  Patient presents to the emergency department with recurrent wounds on his arm and right ear.  He reports that this has happened periodically in the past.  Whenever he gets a small nick on his skin the wound opens up and it gets infected.       Home Medications Prior to Admission medications   Medication Sig Start Date End Date Taking? Authorizing Provider  doxycycline (VIBRAMYCIN) 100 MG capsule Take 1 capsule (100 mg total) by mouth 2 (two) times daily. 10/09/22  Yes Lonya Johannesen, Canary Brim, MD  acetaminophen (TYLENOL) 500 MG tablet Take 500 mg by mouth every 6 (six) hours as needed for mild pain or moderate pain.    [provider]  nystatin (MYCOSTATIN) 100000 UNIT/ML suspension Take 5 mLs (500,000 Units total) by mouth 4 (four) times daily. 09/13/22   Gelene Mink, NP  pantoprazole (PROTONIX) 40 MG tablet Take 1 tablet (40 mg total) by mouth 2 (two) times daily. 30 minutes before breakfast 09/14/22   Dolores Frame, MD      Allergies    Naproxen    Review of Systems   Review of Systems  Physical Exam Updated Vital Signs BP (!) 148/91 (BP Location: Left Arm)   Pulse (!) 48   Temp 98.1 F (36.7 C) (Oral)   Resp 18   Ht 5\' 9"  (1.753 m)   Wt 52.2 kg   SpO2 97%   BMI 16.98 kg/m  Physical Exam Vitals and nursing note reviewed.  Constitutional:      General: He is not in acute distress.    Appearance: He is well-developed.  HENT:     Head: Normocephalic and atraumatic.     Mouth/Throat:     Mouth: Mucous membranes are moist.  Eyes:     General: Vision grossly intact. Gaze aligned appropriately.     Extraocular Movements: Extraocular movements intact.     Conjunctiva/sclera: Conjunctivae normal.  Cardiovascular:      Rate and Rhythm: Normal rate and regular rhythm.     Pulses: Normal pulses.     Heart sounds: Normal heart sounds, S1 normal and S2 normal. No murmur heard.    No friction rub. No gallop.  Pulmonary:     Effort: Pulmonary effort is normal. No respiratory distress.     Breath sounds: Normal breath sounds.  Abdominal:     Palpations: Abdomen is soft.     Tenderness: There is no abdominal tenderness. There is no guarding or rebound.     Hernia: No hernia is present.  Musculoskeletal:        General: No swelling.     Cervical back: Full passive range of motion without pain, normal range of motion and neck supple. No pain with movement, spinous process tenderness or muscular tenderness. Normal range of motion.     Right lower leg: No edema.     Left lower leg: No edema.  Skin:    General: Skin is warm and dry.     Capillary Refill: Capillary refill takes less than 2 seconds.     Findings: Wound (Several scattered slightly ulcerated, erythematous lesions on some of right hand, arm and right ear.) present. No ecchymosis, erythema or lesion.  Neurological:     Mental Status: He is alert and oriented to person, place, and time.     GCS: GCS eye subscore is 4. GCS verbal subscore is 5. GCS motor subscore is 6.     Cranial Nerves: Cranial nerves 2-12 are intact.     Sensory: Sensation is intact.     Motor: Motor function is intact. No weakness or abnormal muscle tone.     Coordination: Coordination is intact.  Psychiatric:        Mood and Affect: Mood normal.        Speech: Speech normal.        Behavior: Behavior normal.     ED Results / Procedures / Treatments   Labs (all labs ordered are listed, but only abnormal results are displayed) Labs Reviewed - No data to display  EKG None  Radiology No results found.  Procedures Procedures    Medications Ordered in ED Medications  doxycycline (VIBRA-TABS) tablet 100 mg (has no administration in time range)    ED Course/ Medical  Decision Making/ A&P                                 Medical Decision Making  Differential diagnosis considered includes, but not limited to: Cellulitis; folliculitis; dermatitis  Patient with several slightly erythematous ulcerated areas on skin that he reports started as small wounds that occurred while working.  He has a history of superinfection.        Final Clinical Impression(s) / ED Diagnoses Final diagnoses:  Folliculitis    Rx / DC Orders ED Discharge Orders          Ordered    doxycycline (VIBRAMYCIN) 100 MG capsule  2 times daily        10/09/22 0336              Gilda Crease, MD 10/09/22 [phone]

## 2022-10-09 NOTE — ED Triage Notes (Signed)
Pt states he thinks he has infected sores to R ear and R forearm and R hand.

## 2022-11-07 ENCOUNTER — Encounter (HOSPITAL_COMMUNITY): Payer: Self-pay | Admitting: Gastroenterology

## 2022-12-17 ENCOUNTER — Emergency Department (HOSPITAL_COMMUNITY)
Admission: EM | Admit: 2022-12-17 | Discharge: 2022-12-17 | Disposition: A | Discharge status: Home / Self Care | Payer: MEDICAID | Attending: Emergency Medicine | Admitting: Emergency Medicine

## 2022-12-17 ENCOUNTER — Other Ambulatory Visit: Payer: Self-pay

## 2022-12-17 ENCOUNTER — Encounter (HOSPITAL_COMMUNITY): Payer: Self-pay | Admitting: Emergency Medicine

## 2022-12-17 ENCOUNTER — Emergency Department (HOSPITAL_COMMUNITY): Payer: MEDICAID

## 2022-12-17 DIAGNOSIS — W540XXA Bitten by dog, initial encounter: Secondary | ICD-10-CM | POA: Diagnosis not present

## 2022-12-17 DIAGNOSIS — S6992XA Unspecified injury of left wrist, hand and finger(s), initial encounter: Secondary | ICD-10-CM | POA: Diagnosis present

## 2022-12-17 DIAGNOSIS — S61412A Laceration without foreign body of left hand, initial encounter: Secondary | ICD-10-CM | POA: Diagnosis not present

## 2022-12-17 MED ORDER — LIDOCAINE HCL (PF) 1 % IJ SOLN
30.0000 mL | Freq: Once | INTRAMUSCULAR | Status: AC
  Administered 2022-12-17: 30 mL
  Filled 2022-12-17: qty 30

## 2022-12-17 MED ORDER — AMOXICILLIN-POT CLAVULANATE 875-125 MG PO TABS
1.0000 | ORAL_TABLET | Freq: Once | ORAL | Status: AC
  Administered 2022-12-17: 1 via ORAL
  Filled 2022-12-17: qty 1

## 2022-12-17 MED ORDER — ACETAMINOPHEN 500 MG PO TABS
1000.0000 mg | ORAL_TABLET | Freq: Once | ORAL | Status: AC
  Administered 2022-12-17: 1000 mg via ORAL
  Filled 2022-12-17: qty 2

## 2022-12-17 MED ORDER — AMOXICILLIN-POT CLAVULANATE 875-125 MG PO TABS: 1.0000 | ORAL_TABLET | Freq: Two times a day (BID) | ORAL | 0 refills | Status: DC

## 2022-12-17 NOTE — ED Provider Notes (Signed)
[place] EMERGENCY DEPARTMENT AT [facility] Provider Note   CSN: [id number] Arrival date & time: 12/17/22  0124     History  Chief Complaint  Patient presents with   Animal Bite    Derrick Lyons is a 47 y.o. male.  Presents with complaints of dog bite to left hand.  Patient's tetanus is up-to-date.  He was bitten by a neighbors dog.       Home Medications Prior to Admission medications   Medication Sig Start Date End Date Taking? Authorizing Provider  amoxicillin-clavulanate (AUGMENTIN) 875-125 MG tablet Take 1 tablet by mouth every 12 (twelve) hours. 12/17/22  Yes Natthew Marlatt, Canary Brim, MD  acetaminophen (TYLENOL) 500 MG tablet Take 500 mg by mouth every 6 (six) hours as needed for mild pain or moderate pain.    [provider]  nystatin (MYCOSTATIN) 100000 UNIT/ML suspension Take 5 mLs (500,000 Units total) by mouth 4 (four) times daily. 09/13/22   Gelene Mink, NP  pantoprazole (PROTONIX) 40 MG tablet Take 1 tablet (40 mg total) by mouth 2 (two) times daily. 30 minutes before breakfast 09/14/22   Dolores Frame, MD      Allergies    Naproxen    Review of Systems   Review of Systems  Physical Exam Updated Vital Signs BP (!) 123/91 (BP Location: Right Arm)   Pulse (!) 101   Temp 97.7 F (36.5 C) (Oral)   Resp 18   SpO2 96%  Physical Exam Constitutional:      Appearance: Normal appearance.  HENT:     Head: Normocephalic and atraumatic.  Musculoskeletal:     Left hand: Laceration present. No deformity. Normal range of motion. Normal strength. Normal sensation. There is no disruption of two-point discrimination. Normal capillary refill.  Skin:    Findings: Laceration present.  Neurological:     Mental Status: He is alert.     ED Results / Procedures / Treatments   Labs (all labs ordered are listed, but only abnormal results are displayed) Labs Reviewed - No data to display  EKG None  Radiology No results  found.  Procedures .Marland KitchenLaceration Repair  Date/Time: 12/17/2022 2:46 AM  Performed by: Gilda Crease, MD Authorized by: Gilda Crease, MD   Consent:    Consent obtained:  Verbal   Consent given by:  Patient   Risks, benefits, and alternatives were discussed: yes     Risks discussed:  Infection and pain Universal protocol:    Procedure explained and questions answered to patient or proxy's satisfaction: yes     Relevant documents present and verified: yes     Test results available: yes     Imaging studies available: yes     Required blood products, implants, devices, and special equipment available: yes     Site/side marked: yes     Immediately prior to procedure, a time out was called: yes     Patient identity confirmed:  Verbally with patient Anesthesia:    Anesthesia method:  Local infiltration   Local anesthetic:  Lidocaine 1% w/o epi Laceration details:    Location:  Hand   Hand location:  L palm   Length (cm):  4 Pre-procedure details:    Preparation:  Patient was prepped and draped in usual sterile fashion and imaging obtained to evaluate for foreign bodies Exploration:    Wound exploration: wound explored through full range of motion and entire depth of wound visualized     Wound extent: no  foreign body, no signs of injury, no nerve damage, no tendon damage, no underlying fracture and no vascular damage     Contaminated: no   Treatment:    Area cleansed with:  Chlorhexidine   Irrigation solution:  Sterile saline   Irrigation volume:  500   Irrigation method:  Syringe   Debridement:  None   Undermining:  None Skin repair:    Repair method:  Sutures   Suture size:  3-0   Suture material:  Prolene   Number of sutures:  3 Approximation:    Approximation:  Loose Repair type:    Repair type:  Simple Post-procedure details:    Dressing:  Bulky dressing   Procedure completion:  Tolerated well, no immediate complications     Medications Ordered  in ED Medications  lidocaine (PF) (XYLOCAINE) 1 % injection 30 mL (has no administration in time range)  acetaminophen (TYLENOL) tablet 1,000 mg (has no administration in time range)  amoxicillin-clavulanate (AUGMENTIN) 875-125 MG per tablet 1 tablet (has no administration in time range)    ED Course/ Medical Decision Making/ A&P                                 Medical Decision Making Risk Prescription drug management.   Patient with laceration over the thenar eminence of the left hand after suffering a dog bite.  He was bitten by a neighbors dog, dog can be quarantined.  Patient's tetanus is up-to-date.  Discussed risks and benefits of sutures and a dog bite, but patient has a jagged flap type laceration that likely would not heal well without at least some stitches.  Patient had 3 stitches to loosely approximate the wound placed and will be placed on antibiotics, given return precautions.        Final Clinical Impression(s) / ED Diagnoses Final diagnoses:  Dog bite, initial encounter    Rx / DC Orders ED Discharge Orders          Ordered    amoxicillin-clavulanate (AUGMENTIN) 875-125 MG tablet  Every 12 hours        12/17/22 0248              Gilda Crease, MD 12/17/22 [phone]

## 2022-12-17 NOTE — ED Triage Notes (Signed)
Pt arrived during downtime.  Pt arrived POV c/o dog bite to Left anterior han. 3 cm laceration present. Bleeding controlled. Pt unsure if dog is up to date on vaccinations. Pt reports he is up to date on tetanus shot ( within last 5 years). Kellogg RN

## 2022-12-17 NOTE — ED Notes (Signed)
Wound cleansed and wrapped. Kellogg RN

## 2022-12-17 NOTE — Discharge Instructions (Addendum)
If you develop any redness, swelling or drainage around the wound, return to the emergency department for an evaluation of possible infection.

## 2023-12-19 ENCOUNTER — Encounter (INDEPENDENT_AMBULATORY_CARE_PROVIDER_SITE_OTHER): Payer: Self-pay | Admitting: Gastroenterology

## 2024-01-06 ENCOUNTER — Inpatient Hospital Stay (HOSPITAL_COMMUNITY)
Admission: EM | Admit: 2024-01-06 | Discharge: 2024-01-09 | DRG: 493 | Disposition: A | Discharge status: Home / Self Care | Payer: MEDICAID | Attending: Orthopedic Surgery | Admitting: Orthopedic Surgery

## 2024-01-06 ENCOUNTER — Emergency Department (HOSPITAL_COMMUNITY): Payer: MEDICAID

## 2024-01-06 ENCOUNTER — Other Ambulatory Visit: Payer: Self-pay

## 2024-01-06 ENCOUNTER — Encounter (HOSPITAL_COMMUNITY): Payer: Self-pay

## 2024-01-06 DIAGNOSIS — D62 Acute posthemorrhagic anemia: Secondary | ICD-10-CM | POA: Diagnosis not present

## 2024-01-06 DIAGNOSIS — S82201A Unspecified fracture of shaft of right tibia, initial encounter for closed fracture: Secondary | ICD-10-CM | POA: Diagnosis present

## 2024-01-06 DIAGNOSIS — Z79899 Other long term (current) drug therapy: Secondary | ICD-10-CM

## 2024-01-06 DIAGNOSIS — S82442A Displaced spiral fracture of shaft of left fibula, initial encounter for closed fracture: Principal | ICD-10-CM | POA: Diagnosis present

## 2024-01-06 DIAGNOSIS — S82392A Other fracture of lower end of left tibia, initial encounter for closed fracture: Secondary | ICD-10-CM

## 2024-01-06 DIAGNOSIS — S82302A Unspecified fracture of lower end of left tibia, initial encounter for closed fracture: Secondary | ICD-10-CM

## 2024-01-06 DIAGNOSIS — F1910 Other psychoactive substance abuse, uncomplicated: Secondary | ICD-10-CM | POA: Diagnosis present

## 2024-01-06 DIAGNOSIS — Y92410 Unspecified street and highway as the place of occurrence of the external cause: Secondary | ICD-10-CM

## 2024-01-06 DIAGNOSIS — S82242A Displaced spiral fracture of shaft of left tibia, initial encounter for closed fracture: Principal | ICD-10-CM | POA: Diagnosis present

## 2024-01-06 DIAGNOSIS — Z886 Allergy status to analgesic agent status: Secondary | ICD-10-CM

## 2024-01-06 MED ORDER — KETOROLAC TROMETHAMINE 15 MG/ML IJ SOLN
15.0000 mg | Freq: Once | INTRAMUSCULAR | Status: DC
  Filled 2024-01-06: qty 1

## 2024-01-06 MED ORDER — ONDANSETRON HCL 4 MG/2ML IJ SOLN
4.0000 mg | Freq: Once | INTRAMUSCULAR | Status: AC
  Administered 2024-01-06: 4 mg via INTRAVENOUS
  Filled 2024-01-06: qty 2

## 2024-01-06 MED ORDER — KETOROLAC TROMETHAMINE 15 MG/ML IJ SOLN
15.0000 mg | Freq: Once | INTRAMUSCULAR | Status: AC
  Administered 2024-01-06: 15 mg via INTRAVENOUS

## 2024-01-06 MED ORDER — SODIUM CHLORIDE 0.9 % IV BOLUS
1000.0000 mL | Freq: Once | INTRAVENOUS | Status: AC
  Administered 2024-01-06: 1000 mL via INTRAVENOUS

## 2024-01-06 MED ORDER — HYDROMORPHONE HCL 1 MG/ML IJ SOLN
1.0000 mg | Freq: Once | INTRAMUSCULAR | Status: AC
  Administered 2024-01-06: 1 mg via INTRAVENOUS
  Filled 2024-01-06: qty 1
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
## 2024-01-07 DIAGNOSIS — S82201A Unspecified fracture of shaft of right tibia, initial encounter for closed fracture: Secondary | ICD-10-CM | POA: Diagnosis present

## 2024-01-07 DIAGNOSIS — S82391A Other fracture of lower end of right tibia, initial encounter for closed fracture: Secondary | ICD-10-CM

## 2024-01-07 LAB — CBC WITH DIFFERENTIAL/PLATELET
Abs Immature Granulocytes: 0.04 K/uL (ref 0.00–0.07)
Basophils Absolute: 0 K/uL (ref 0.0–0.1)
Basophils Relative: 0 %
Eosinophils Absolute: 0.1 K/uL (ref 0.0–0.5)
Eosinophils Relative: 1 %
HCT: 40.4 % (ref 39.0–52.0)
Hemoglobin: 13.7 g/dL (ref 13.0–17.0)
Immature Granulocytes: 0 %
Lymphocytes Relative: 11 %
Lymphs Abs: 1.3 K/uL (ref 0.7–4.0)
MCH: 31.4 pg (ref 26.0–34.0)
MCHC: 33.9 g/dL (ref 30.0–36.0)
MCV: 92.7 fL (ref 80.0–100.0)
Monocytes Absolute: 1.1 K/uL — ABNORMAL HIGH (ref 0.1–1.0)
Monocytes Relative: 9 %
Neutro Abs: 9.2 K/uL — ABNORMAL HIGH (ref 1.7–7.7)
Neutrophils Relative %: 79 %
Platelets: 231 K/uL (ref 150–400)
RBC: 4.36 MIL/uL (ref 4.22–5.81)
RDW: 12.1 % (ref 11.5–15.5)
WBC: 11.7 K/uL — ABNORMAL HIGH (ref 4.0–10.5)
nRBC: 0 % (ref 0.0–0.2)

## 2024-01-07 LAB — BASIC METABOLIC PANEL WITH GFR
Anion gap: 11 (ref 5–15)
BUN: 15 mg/dL (ref 6–20)
CO2: 23 mmol/L (ref 22–32)
Calcium: 8.4 mg/dL — ABNORMAL LOW (ref 8.9–10.3)
Chloride: 104 mmol/L (ref 98–111)
Creatinine, Ser: 0.87 mg/dL (ref 0.61–1.24)
GFR, Estimated: >60 mL/min (ref >60)
Glucose, Bld: 60 mg/dL — ABNORMAL LOW (ref 70–99)
Potassium: 4.2 mmol/L (ref 3.5–5.1)
Sodium: 138 mmol/L (ref 135–145)

## 2024-01-07 LAB — TYPE AND SCREEN
ABO/RH(D): O POS
Antibody Screen: NEGATIVE

## 2024-01-07 LAB — HIV ANTIBODY (ROUTINE TESTING W REFLEX): HIV Screen 4th Generation wRfx: NONREACTIVE

## 2024-01-07 LAB — ETHANOL: Alcohol, Ethyl (B): <15 mg/dL (ref <15)

## 2024-01-07 MED ORDER — LACTATED RINGERS IV SOLN: INTRAVENOUS | Status: DC

## 2024-01-07 MED ORDER — SENNA 8.6 MG PO TABS
1.0000 | ORAL_TABLET | Freq: Two times a day (BID) | ORAL | Status: DC
  Administered 2024-01-07 – 2024-01-09 (×2): 8.6 mg via ORAL
  Filled 2024-01-07 (×5): qty 1

## 2024-01-07 MED ORDER — PANTOPRAZOLE SODIUM 40 MG PO TBEC
40.0000 mg | DELAYED_RELEASE_TABLET | Freq: Two times a day (BID) | ORAL | Status: DC
  Administered 2024-01-07 – 2024-01-09 (×4): 40 mg via ORAL
  Filled 2024-01-07 (×5): qty 1

## 2024-01-07 MED ORDER — OXYCODONE HCL 5 MG PO TABS
2.5000 mg | ORAL_TABLET | ORAL | Status: DC | PRN
  Administered 2024-01-07 – 2024-01-09 (×9): 5 mg via ORAL
  Filled 2024-01-07 (×9): qty 1

## 2024-01-07 MED ORDER — TRANEXAMIC ACID-NACL 1000-0.7 MG/100ML-% IV SOLN: 1000.0000 mg | INTRAVENOUS | Status: DC

## 2024-01-07 MED ORDER — POLYETHYLENE GLYCOL 3350 17 G PO PACK
17.0000 g | PACK | Freq: Every day | ORAL | Status: DC
  Filled 2024-01-07: qty 1

## 2024-01-07 MED ORDER — MORPHINE SULFATE (PF) 4 MG/ML IV SOLN
4.0000 mg | INTRAVENOUS | Status: DC | PRN
  Administered 2024-01-07: 4 mg via INTRAVENOUS
  Filled 2024-01-07: qty 1

## 2024-01-07 MED ORDER — CEFAZOLIN SODIUM-DEXTROSE 2-4 GM/100ML-% IV SOLN: 2.0000 g | INTRAVENOUS | Status: DC

## 2024-01-07 MED ORDER — CEFAZOLIN SODIUM-DEXTROSE 2-4 GM/100ML-% IV SOLN
2.0000 g | INTRAVENOUS | Status: AC
  Administered 2024-01-08: 2 g via INTRAVENOUS
  Filled 2024-01-07: qty 100

## 2024-01-07 MED ORDER — HYDROMORPHONE HCL 1 MG/ML IJ SOLN
0.5000 mg | INTRAMUSCULAR | Status: DC | PRN
  Administered 2024-01-07 (×3): 0.5 mg via INTRAVENOUS
  Administered 2024-01-08: .5 mg via INTRAVENOUS
  Administered 2024-01-08 (×2): 0.5 mg via INTRAVENOUS
  Filled 2024-01-07 (×5): qty 0.5

## 2024-01-07 MED ORDER — ENSURE SURGERY PO LIQD
237.0000 mL | Freq: Two times a day (BID) | ORAL | Status: DC
  Administered 2024-01-07 – 2024-01-09 (×3): 237 mL via ORAL
  Filled 2024-01-07 (×6): qty 237

## 2024-01-07 MED ORDER — ACETAMINOPHEN 500 MG PO TABS
1000.0000 mg | ORAL_TABLET | Freq: Three times a day (TID) | ORAL | Status: DC
  Administered 2024-01-07 – 2024-01-08 (×5): 1000 mg via ORAL
  Filled 2024-01-07 (×7): qty 2

## 2024-01-07 MED ORDER — ENOXAPARIN SODIUM 40 MG/0.4ML IJ SOSY
40.0000 mg | PREFILLED_SYRINGE | INTRAMUSCULAR | Status: DC
  Administered 2024-01-07: 40 mg via SUBCUTANEOUS
  Filled 2024-01-07: qty 0.4

## 2024-01-07 NOTE — Anesthesia Preprocedure Evaluation (Signed)
 Anesthesia Evaluation  Patient identified by MRN, date of birth, ID band Patient awake    Reviewed: Allergy & Precautions, NPO status , Patient's Chart, lab work & pertinent test results  Airway Mallampati: I  TM Distance: >3 FB Neck ROM: Full    Dental  (+) Dental Advisory Given   Pulmonary asthma , Current Smoker and Patient abstained from smoking.   Pulmonary exam normal breath sounds clear to auscultation       Cardiovascular negative cardio ROS Normal cardiovascular exam Rhythm:Regular Rate:Normal     Neuro/Psych Seizures -,  PSYCHIATRIC DISORDERS         GI/Hepatic negative GI ROS,,,(+)     substance abuse  alcohol use, cocaine use and marijuana use  Endo/Other  negative endocrine ROS    Renal/GU negative Renal ROS     Musculoskeletal negative musculoskeletal ROS (+)    Abdominal   Peds  Hematology negative hematology ROS (+)   Anesthesia Other Findings   Reproductive/Obstetrics                              Anesthesia Physical Anesthesia Plan  ASA: 3  Anesthesia Plan: General   Post-op Pain Management: Tylenol  PO (pre-op)* and Regional block*   Induction: Intravenous  PONV Risk Score and Plan: 2 and Ondansetron , Dexamethasone , Treatment may vary due to age or medical condition and Midazolam   Airway Management Planned: Oral ETT  Additional Equipment:   Intra-op Plan:   Post-operative Plan: Extubation in OR  Informed Consent: I have reviewed the patients History and Physical, chart, labs and discussed the procedure including the risks, benefits and alternatives for the proposed anesthesia with the patient or authorized representative who has indicated his/her understanding and acceptance.     Dental advisory given  Plan Discussed with: CRNA  Anesthesia Plan Comments:          Anesthesia Quick Evaluation

## 2024-01-07 NOTE — ED Provider Notes (Signed)
 Blood pressure (!) 84/54, pulse (!) 48, temperature 97.6 F (36.4 C), temperature source Axillary, resp. rate 15, height 5' 9 (1.753 m), weight 59 kg, SpO2 99%.  Assuming care from Dr. Ula.  In short, Derrick Lyons is a 48 y.o. male with a chief complaint of Optician, Dispensing .  Refer to the original H&P for additional details.  The current plan of care is to follow up with ortho.  Consulted with Ortho, Dr. Georgina. Plan for admit.    Darra Fonda MATSU, MD 01/07/24 [phone]

## 2024-01-07 NOTE — Progress Notes (Signed)
 Orthopedic Tech Progress Note Patient Details:  Derrick Lyons Jun 14, 1975 [id number]  Patient ID: Derrick Lyons, male   DOB: Nov 20, 1975, 48 y.o.   MRN: [id number] Crutches order put in before admit decided. Crutches will be given post op. [name] PARAS 01/07/2024, 6:27 AM

## 2024-01-07 NOTE — Progress Notes (Signed)
 PT Cancellation Note  Patient Details Name: Derrick Lyons MRN: [id number] DOB: Dec 04, 1975   Cancelled Treatment:    Reason Eval/Treat Not Completed: Patient not medically ready. Pt for OR tomorrow. Will see post op.   Rodgers ORN [facility] 01/07/2024, 10:52 AM Rodgers Opal PT Acute [employer] [phone]
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
## 2024-01-07 NOTE — Progress Notes (Signed)
 Orthopedic Tech Progress Note Patient Details:  Derrick Lyons 03-30-1975 [id number]  Ortho Devices Type of Ortho Device: Post (short leg) splint, Stirrup splint Ortho Device/Splint Location: lle Ortho Device/Splint Interventions: Ordered, Application, Adjustment   Post Interventions Patient Tolerated: Well Instructions Provided: Care of device, Adjustment of device  [name] PARAS 01/07/2024, 12:31 AM

## 2024-01-08 ENCOUNTER — Encounter (HOSPITAL_COMMUNITY): Payer: Self-pay | Admitting: Orthopedic Surgery

## 2024-01-08 ENCOUNTER — Encounter (HOSPITAL_COMMUNITY): Payer: MEDICAID | Admitting: Anesthesiology

## 2024-01-08 ENCOUNTER — Inpatient Hospital Stay (HOSPITAL_COMMUNITY): Payer: MEDICAID

## 2024-01-08 ENCOUNTER — Encounter (HOSPITAL_COMMUNITY): Admission: EM | Disposition: A | Payer: Self-pay | Source: Home / Self Care | Attending: Orthopedic Surgery

## 2024-01-08 ENCOUNTER — Inpatient Hospital Stay (HOSPITAL_COMMUNITY): Payer: MEDICAID | Admitting: Anesthesiology

## 2024-01-08 DIAGNOSIS — S82392A Other fracture of lower end of left tibia, initial encounter for closed fracture: Secondary | ICD-10-CM | POA: Diagnosis not present

## 2024-01-08 DIAGNOSIS — S82202A Unspecified fracture of shaft of left tibia, initial encounter for closed fracture: Secondary | ICD-10-CM

## 2024-01-08 HISTORY — PX: ORIF TIBIA FRACTURE: SHX5416

## 2024-01-08 LAB — RAPID URINE DRUG SCREEN, HOSP PERFORMED
Amphetamines: NOT DETECTED
Barbiturates: NOT DETECTED
Benzodiazepines: POSITIVE — AB
Cocaine: POSITIVE — AB
Opiates: POSITIVE — AB
Tetrahydrocannabinol: POSITIVE — AB

## 2024-01-08 LAB — SURGICAL PCR SCREEN
MRSA, PCR: NEGATIVE
Staphylococcus aureus: POSITIVE — AB

## 2024-01-08 SURGERY — OPEN REDUCTION INTERNAL FIXATION (ORIF) TIBIA FRACTURE
Anesthesia: General | Laterality: Left

## 2024-01-08 MED ORDER — CHLORHEXIDINE GLUCONATE 0.12 % MT SOLN: 15.0000 mL | Freq: Once | OROMUCOSAL | Status: AC

## 2024-01-08 MED ORDER — EPHEDRINE SULFATE-NACL 50-0.9 MG/10ML-% IV SOSY
PREFILLED_SYRINGE | INTRAVENOUS | Status: DC | PRN
  Administered 2024-01-08: 5 mg via INTRAVENOUS
  Administered 2024-01-08: 15 mg via INTRAVENOUS
  Administered 2024-01-08: 5 mg via INTRAVENOUS

## 2024-01-08 MED ORDER — PHENYLEPHRINE HCL-NACL 20-0.9 MG/250ML-% IV SOLN
INTRAVENOUS | Status: DC | PRN
  Administered 2024-01-08 (×2): 80 ug via INTRAVENOUS
  Administered 2024-01-08: 40 ug/min via INTRAVENOUS
  Administered 2024-01-08: 80 ug via INTRAVENOUS

## 2024-01-08 MED ORDER — HYDROMORPHONE HCL 1 MG/ML IJ SOLN
INTRAMUSCULAR | Status: AC
  Filled 2024-01-08: qty 0.5

## 2024-01-08 MED ORDER — VANCOMYCIN HCL 1000 MG IV SOLR
INTRAVENOUS | Status: AC
  Filled 2024-01-08: qty 20

## 2024-01-08 MED ORDER — ONDANSETRON HCL 4 MG/2ML IJ SOLN
INTRAMUSCULAR | Status: AC
  Filled 2024-01-08: qty 2

## 2024-01-08 MED ORDER — MIDAZOLAM HCL 2 MG/2ML IJ SOLN
INTRAMUSCULAR | Status: AC
  Filled 2024-01-08: qty 2

## 2024-01-08 MED ORDER — CHLORHEXIDINE GLUCONATE CLOTH 2 % EX PADS
6.0000 | MEDICATED_PAD | Freq: Every day | CUTANEOUS | Status: DC
  Administered 2024-01-08 – 2024-01-09 (×2): 6 via TOPICAL

## 2024-01-08 MED ORDER — GLYCOPYRROLATE PF 0.2 MG/ML IJ SOSY
PREFILLED_SYRINGE | INTRAMUSCULAR | Status: DC | PRN
  Administered 2024-01-08: .2 mg via INTRAVENOUS

## 2024-01-08 MED ORDER — 0.9 % SODIUM CHLORIDE (POUR BTL) OPTIME
TOPICAL | Status: DC | PRN
  Administered 2024-01-08: 1000 mL

## 2024-01-08 MED ORDER — TRANEXAMIC ACID-NACL 1000-0.7 MG/100ML-% IV SOLN
1000.0000 mg | INTRAVENOUS | Status: AC
  Administered 2024-01-08: 1000 mg via INTRAVENOUS
  Filled 2024-01-08: qty 100

## 2024-01-08 MED ORDER — ROCURONIUM BROMIDE 10 MG/ML (PF) SYRINGE
PREFILLED_SYRINGE | INTRAVENOUS | Status: AC
  Filled 2024-01-08: qty 10

## 2024-01-08 MED ORDER — LACTATED RINGERS IV SOLN: INTRAVENOUS | Status: DC

## 2024-01-08 MED ORDER — CHLORHEXIDINE GLUCONATE 0.12 % MT SOLN
OROMUCOSAL | Status: AC
  Administered 2024-01-08: 15 mL via OROMUCOSAL
  Filled 2024-01-08: qty 15

## 2024-01-08 MED ORDER — DEXAMETHASONE SODIUM PHOSPHATE 4 MG/ML IJ SOLN
INTRAMUSCULAR | Status: DC | PRN
  Administered 2024-01-08: 2 mg via PERINEURAL
  Administered 2024-01-08: 3 mg via PERINEURAL

## 2024-01-08 MED ORDER — VANCOMYCIN HCL 1000 MG IV SOLR
INTRAVENOUS | Status: DC | PRN
  Administered 2024-01-08: 1000 mg

## 2024-01-08 MED ORDER — CEFAZOLIN SODIUM-DEXTROSE 2-4 GM/100ML-% IV SOLN
2.0000 g | Freq: Four times a day (QID) | INTRAVENOUS | Status: AC
  Administered 2024-01-08 – 2024-01-09 (×3): 2 g via INTRAVENOUS
  Filled 2024-01-08 (×3): qty 100

## 2024-01-08 MED ORDER — ORAL CARE MOUTH RINSE: 15.0000 mL | Freq: Once | OROMUCOSAL | Status: AC

## 2024-01-08 MED ORDER — TRANEXAMIC ACID-NACL 1000-0.7 MG/100ML-% IV SOLN
1000.0000 mg | Freq: Once | INTRAVENOUS | Status: AC
  Administered 2024-01-08: 1000 mg via INTRAVENOUS
  Filled 2024-01-08: qty 100

## 2024-01-08 MED ORDER — ROCURONIUM BROMIDE 10 MG/ML (PF) SYRINGE
PREFILLED_SYRINGE | INTRAVENOUS | Status: DC | PRN
  Administered 2024-01-08: 40 mg via INTRAVENOUS
  Administered 2024-01-08 (×2): 30 mg via INTRAVENOUS

## 2024-01-08 MED ORDER — HYDROMORPHONE HCL 1 MG/ML IJ SOLN
0.5000 mg | INTRAMUSCULAR | Status: DC | PRN
  Administered 2024-01-09 (×2): 0.5 mg via INTRAVENOUS
  Filled 2024-01-08 (×2): qty 0.5

## 2024-01-08 MED ORDER — PROPOFOL 10 MG/ML IV BOLUS
INTRAVENOUS | Status: AC
Start: 2024-01-08 — End: 2024-01-08
  Filled 2024-01-08: qty 20

## 2024-01-08 MED ORDER — SUGAMMADEX SODIUM 200 MG/2ML IV SOLN
INTRAVENOUS | Status: DC | PRN
  Administered 2024-01-08: 200 mg via INTRAVENOUS

## 2024-01-08 MED ORDER — HYDROMORPHONE HCL 1 MG/ML IJ SOLN: 0.2500 mg | INTRAMUSCULAR | Status: DC | PRN

## 2024-01-08 MED ORDER — DROPERIDOL 2.5 MG/ML IJ SOLN: 0.6250 mg | Freq: Once | INTRAMUSCULAR | Status: DC | PRN

## 2024-01-08 MED ORDER — PROPOFOL 10 MG/ML IV BOLUS
INTRAVENOUS | Status: DC | PRN
  Administered 2024-01-08: 100 mg via INTRAVENOUS
  Administered 2024-01-08: 30 mg via INTRAVENOUS

## 2024-01-08 MED ORDER — LIDOCAINE 2% (20 MG/ML) 5 ML SYRINGE
INTRAMUSCULAR | Status: AC
  Filled 2024-01-08: qty 5

## 2024-01-08 MED ORDER — FENTANYL CITRATE (PF) 100 MCG/2ML IJ SOLN
INTRAMUSCULAR | Status: AC
  Filled 2024-01-08: qty 2

## 2024-01-08 MED ORDER — ROPIVACAINE HCL 5 MG/ML IJ SOLN
INTRAMUSCULAR | Status: DC | PRN
  Administered 2024-01-08: 15 mL via PERINEURAL
  Administered 2024-01-08: 25 mL via PERINEURAL

## 2024-01-08 MED ORDER — MIDAZOLAM HCL (PF) 2 MG/2ML IJ SOLN: 1.0000 mg | Freq: Once | INTRAMUSCULAR | Status: AC

## 2024-01-08 MED ORDER — MIDAZOLAM HCL (PF) 2 MG/2ML IJ SOLN
INTRAMUSCULAR | Status: DC | PRN
  Administered 2024-01-08: 2 mg via INTRAVENOUS

## 2024-01-08 MED ORDER — FENTANYL CITRATE (PF) 100 MCG/2ML IJ SOLN
INTRAMUSCULAR | Status: AC
  Administered 2024-01-08: 100 ug via INTRAVENOUS
  Filled 2024-01-08: qty 2

## 2024-01-08 MED ORDER — FENTANYL CITRATE (PF) 100 MCG/2ML IJ SOLN: 100.0000 ug | Freq: Once | INTRAMUSCULAR | Status: AC

## 2024-01-08 MED ORDER — LIDOCAINE 2% (20 MG/ML) 5 ML SYRINGE
INTRAMUSCULAR | Status: DC | PRN
Start: 2024-01-08 — End: 2024-01-08
  Administered 2024-01-08: 60 mg via INTRAVENOUS

## 2024-01-08 MED ORDER — MUPIROCIN 2 % EX OINT
1.0000 | TOPICAL_OINTMENT | Freq: Two times a day (BID) | CUTANEOUS | Status: DC
  Administered 2024-01-08 – 2024-01-09 (×3): 1 via NASAL
  Filled 2024-01-08: qty 22

## 2024-01-08 MED ORDER — DEXAMETHASONE SOD PHOSPHATE PF 10 MG/ML IJ SOLN
INTRAMUSCULAR | Status: DC | PRN
  Administered 2024-01-08: 10 mg via INTRAVENOUS

## 2024-01-08 MED ORDER — CLONIDINE HCL (ANALGESIA) 100 MCG/ML EP SOLN
EPIDURAL | Status: DC | PRN
  Administered 2024-01-08: 50 ug
  Administered 2024-01-08: 30 ug

## 2024-01-08 MED ORDER — ONDANSETRON HCL 4 MG/2ML IJ SOLN
INTRAMUSCULAR | Status: DC | PRN
  Administered 2024-01-08: 4 mg via INTRAVENOUS

## 2024-01-08 MED ORDER — MIDAZOLAM HCL 2 MG/2ML IJ SOLN
INTRAMUSCULAR | Status: AC
  Administered 2024-01-08: 1 mg via INTRAVENOUS
  Filled 2024-01-08: qty 2

## 2024-01-08 MED ORDER — FENTANYL CITRATE (PF) 250 MCG/5ML IJ SOLN
INTRAMUSCULAR | Status: DC | PRN
  Administered 2024-01-08: 100 ug via INTRAVENOUS

## 2024-01-08 MED ORDER — ALBUMIN HUMAN 5 % IV SOLN: INTRAVENOUS | Status: DC | PRN

## 2024-01-08 SURGICAL SUPPLY — 50 items
BAG COUNTER SPONGE SURGICOUNT (BAG) ×1 IMPLANT
BIT DRILL QC 2.0 SHORT EVOS SM (DRILL) IMPLANT
BIT DRILL QC 2.5MM SHRT EVO SM (DRILL) IMPLANT
BNDG COHESIVE 4X5 TAN STRL LF (GAUZE/BANDAGES/DRESSINGS) ×1 IMPLANT
CANISTER SUCTION 3000ML PPV (SUCTIONS) ×1 IMPLANT
COVER SURGICAL LIGHT HANDLE (MISCELLANEOUS) ×1 IMPLANT
CUFF TRNQT CYL 34X4.125X (TOURNIQUET CUFF) ×1 IMPLANT
DRAPE C-ARM 42X72 X-RAY (DRAPES) ×1 IMPLANT
DRAPE C-ARMOR (DRAPES) ×1 IMPLANT
DRAPE U-SHAPE 47X51 STRL (DRAPES) ×1 IMPLANT
DRESSING PEEL AND PLAC PRVNA20 (GAUZE/BANDAGES/DRESSINGS) IMPLANT
DRSG XEROFORM 1X8 (GAUZE/BANDAGES/DRESSINGS) IMPLANT
DURAPREP 26ML APPLICATOR (WOUND CARE) ×1 IMPLANT
ELECT COATED BLADE 2.86 ST (ELECTRODE) ×1 IMPLANT
ELECTRODE REM PT RTRN 9FT ADLT (ELECTROSURGICAL) ×1 IMPLANT
GAUZE PAD ABD 8X10 STRL (GAUZE/BANDAGES/DRESSINGS) IMPLANT
GAUZE SPONGE 4X4 12PLY STRL (GAUZE/BANDAGES/DRESSINGS) ×1 IMPLANT
GLOVE INDICATOR 7.5 STRL GRN (GLOVE) ×1 IMPLANT
GLOVE SS BIOGEL STRL SZ 7.5 (GLOVE) ×1 IMPLANT
GOWN STRL REUS W/ TWL LRG LVL3 (GOWN DISPOSABLE) ×1 IMPLANT
GOWN STRL REUS W/ TWL XL LVL3 (GOWN DISPOSABLE) ×1 IMPLANT
GOWN STRL SURGICAL XL XLNG (GOWN DISPOSABLE) ×2 IMPLANT
KIT BASIN OR (CUSTOM PROCEDURE TRAY) ×1 IMPLANT
KIT TURNOVER KIT B (KITS) ×1 IMPLANT
KWIRE FX150X1.6XTROC PNT (WIRE) IMPLANT
PACK ORTHO EXTREMITY (CUSTOM PROCEDURE TRAY) ×1 IMPLANT
PAD ARMBOARD POSITIONER FOAM (MISCELLANEOUS) ×2 IMPLANT
PAD CAST 4YDX4 CTTN HI CHSV (CAST SUPPLIES) ×2 IMPLANT
PLATE TIB EVOS 12H L 2.7X162 (Plate) IMPLANT
SCREW BONE LOCKING 2.7 X 28 (Screw) IMPLANT
SCREW CORT 3.5X19 ST EVOS (Screw) IMPLANT
SCREW CORT 3.5X20 ST EVOS (Screw) IMPLANT
SCREW CORT 3.5X22 ST EVOS (Screw) IMPLANT
SCREW CORT 3.5X30 ST EVOS (Screw) IMPLANT
SCREW CORTEX 3.5X24MM (Screw) IMPLANT
SCREW CORTEX 3.5X26 (Screw) IMPLANT
SCREW LOCK EVOS ST 3.5X34 (Screw) IMPLANT
SCREW LOCK ST EVOS 3.5X38 (Screw) IMPLANT
SOLN 0.9% NACL POUR BTL 1000ML (IV SOLUTION) ×1 IMPLANT
SPONGE T-LAP 18X18 ~~LOC~~+RFID (SPONGE) ×1 IMPLANT
SUCTION TUBE FRAZIER 10FR DISP (SUCTIONS) ×1 IMPLANT
SUT ETHILON 2 0 FS 18 (SUTURE) ×1 IMPLANT
SUT VIC AB 0 CT1 18XCR BRD8 (SUTURE) IMPLANT
SUT VIC AB 0 CT1 27XBRD ANBCTR (SUTURE) IMPLANT
SUT VIC AB 2-0 CT1 18 (SUTURE) ×1 IMPLANT
SUT VIC AB 2-0 CT1 TAPERPNT 27 (SUTURE) IMPLANT
SUT VIC AB 2-0 CT2 18 VCP726D (SUTURE) ×1 IMPLANT
TOWEL GREEN STERILE (TOWEL DISPOSABLE) ×1 IMPLANT
TOWEL GREEN STERILE FF (TOWEL DISPOSABLE) ×1 IMPLANT
TUBE CONNECTING 12X1/4 (SUCTIONS) ×1 IMPLANT
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
## 2024-01-08 NOTE — Transfer of Care (Signed)
 Immediate Anesthesia Transfer of Care Note  Patient: Derrick Lyons  Procedure(s) Performed: OPEN REDUCTION INTERNAL FIXATION (ORIF) TIBIA FRACTURE (Left)  Patient Location: PACU  Anesthesia Type:GA combined with regional for post-op pain  Level of Consciousness: drowsy  Airway & Oxygen Therapy: Patient Spontanous Breathing and Patient connected to face mask oxygen  Post-op Assessment: Report given to RN and Post -op Vital signs reviewed and stable  Post vital signs: Reviewed and stable  Last Vitals:  Vitals Value Taken Time  BP 95/56 01/08/24 12:50  Temp 36.4 C 01/08/24 12:50  Pulse 57 01/08/24 12:50  Resp 11 01/08/24 12:50  SpO2 100 % 01/08/24 12:50    Last Pain:  Vitals:   01/08/24 1250  TempSrc:   PainSc: Asleep      Patients Stated Pain Goal: 3 (01/08/24 0857)  Complications: No notable events documented.

## 2024-01-08 NOTE — Progress Notes (Signed)
 Surgical PCR positive for Staph aureus, per protocol orders for Bactroban  and CHG implemented.

## 2024-01-08 NOTE — Progress Notes (Signed)
 Orthopedic Surgery Progress Note   Assessment: Patient is a 48 y.o. male with left distal tibia fracture   Plan: -Operative plans: planning for operative fixation today -Diet: NPO for procedure -DVT ppx: lovenox  -Ancef  and TXA on call to OR -Weight bearing status: NWB LLE -PT evaluate and treat post-op -Pain control -Dispo: pending completion of operative plans  ___________________________________________________________________________  Subjective: No acute events overnight. Pain well controlled. Understands plan for surgery today. All his questions were answered to his satisfaction.   Physical Exam:  General: no acute distress, appears stated age Neurologic: alert, answering questions appropriately, following commands Respiratory: unlabored breathing on room air, symmetric chest rise Psychiatric: appropriate affect, normal cadence to speech  MSK:    -Left lower extremity  Short leg splint in place EHL/TA/GSC intact No increased pain with passive stretch Plantarflexes and dorsiflexes toes Sensation intact to light touch in sural, saphenous, tibial, deep peroneal, and superficial peroneal nerve distributions Foot warm and well perfused   Yesterday's total administered Morphine  Milligram Equivalents: 64.5   Patient name: Derrick Lyons Patient MRN: [id number] Date: 01/08/24

## 2024-01-08 NOTE — Progress Notes (Signed)
 PT Cancellation Note  Patient Details Name: JAQUIN COY MRN: [id number] DOB: [birth year]   Cancelled Treatment:    Reason Eval/Treat Not Completed: Patient at procedure or test/unavailable (Pt off floor for left tibia ORIF. PT will follow-up for evaluation after procedure.)  Randall JONELLE, PT, DPT Acute Rehabilitation Services Office: [phone] Secure Chat Preferred  Delon CHRISTELLA Callander 01/08/2024, 8:45 AM

## 2024-01-08 NOTE — Anesthesia Procedure Notes (Signed)
 Anesthesia Regional Block: Popliteal block   Pre-Anesthetic Checklist: , timeout performed,  Correct Patient, Correct Site, Correct Laterality,  Correct Procedure, Correct Position, site marked,  Risks and benefits discussed,  Surgical consent,  Pre-op evaluation,  At surgeon's request and post-op pain management  Laterality: Lower and Left  Prep: chloraprep       Needles:  Injection technique: Single-shot  Needle Type: Stimiplex     Needle Length: 10cm  Needle Gauge: 21     Additional Needles:   Procedures:,,,, ultrasound used (permanent image in chart),,   Motor weakness within 5 minutes.  Narrative:  Start time: 01/08/2024 10:00 AM End time: 01/08/2024 10:05 AM Injection made incrementally with aspirations every 5 mL.  Performed by: Personally  Anesthesiologist: Darlyn Rush, MD  Additional Notes: Nerve located and needle positioned with direct ultrasound guidance. Good perineural spread. Patient tolerated well.

## 2024-01-08 NOTE — Anesthesia Procedure Notes (Signed)
 Procedure Name: Intubation Date/Time: 01/08/2024 10:45 AM  Performed by: Lanning Cena RAMAN, CRNAPre-anesthesia Checklist: Patient identified, Emergency Drugs available, Suction available and Patient being monitored Patient Re-evaluated:Patient Re-evaluated prior to induction Oxygen Delivery Method: Circle system utilized Preoxygenation: Pre-oxygenation with 100% oxygen Induction Type: IV induction Ventilation: Mask ventilation without difficulty Laryngoscope Size: Miller and 2 Grade View: Grade II Tube size: 7.0 mm Number of attempts: 1 Airway Equipment and Method: Stylet Placement Confirmation: ETT inserted through vocal cords under direct vision, positive ETCO2, CO2 detector and breath sounds checked- equal and bilateral Secured at: 22 cm Tube secured with: Tape Dental Injury: Teeth and Oropharynx as per pre-operative assessment

## 2024-01-08 NOTE — Anesthesia Procedure Notes (Signed)
 Anesthesia Regional Block: Adductor canal block   Pre-Anesthetic Checklist: , timeout performed,  Correct Patient, Correct Site, Correct Laterality,  Correct Procedure, Correct Position, site marked,  Risks and benefits discussed,  Surgical consent,  Pre-op evaluation,  At surgeon's request and post-op pain management  Laterality: Lower and Left  Prep: chloraprep       Needles:  Injection technique: Single-shot  Needle Type: Stimiplex     Needle Length: 9cm  Needle Gauge: 21     Additional Needles:   Procedures:,,,, ultrasound used (permanent image in chart),,    Narrative:  Start time: 01/08/2024 9:45 AM End time: 01/08/2024 10:00 AM Injection made incrementally with aspirations every 5 mL.  Performed by: Personally  Anesthesiologist: Darlyn Rush, MD  Additional Notes: BP cuff, EKG monitors applied. Sedation begun. Artery and nerve location verified with ultrasound. Anesthetic injected incrementally (5ml), slowly, and after negative aspirations under direct u/s guidance. Good fascial/perineural spread. Tolerated well.
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
## 2024-01-08 NOTE — Brief Op Note (Signed)
 01/08/2024  12:59 PM  PATIENT:  Derrick Lyons  48 y.o. male  PRE-OPERATIVE DIAGNOSIS:  LEFT TIBIA FRACTURE  POST-OPERATIVE DIAGNOSIS:  LEFT TIBIA FRACTURE  PROCEDURE:  Procedure(s): OPEN REDUCTION INTERNAL FIXATION (ORIF) TIBIA FRACTURE (Left)  SURGEON:  Surgeons and Role:    DEWAINE Georgina Ozell DELENA, MD - Primary  PHYSICIAN ASSISTANT: none  ASSISTANTS: none   ANESTHESIA:   general  EBL:  250 mL   BLOOD ADMINISTERED:none  DRAINS: none   LOCAL MEDICATIONS USED:  NONE  SPECIMEN:  No Specimen  DISPOSITION OF SPECIMEN:  N/A  COUNTS:  YES  TOURNIQUET:  NONE  DICTATION: .Note written in EPIC  PLAN OF CARE: Admit to inpatient   PATIENT DISPOSITION:  PACU - hemodynamically stable.   Delay start of Pharmacological VTE agent (>24hrs) due to surgical blood loss or risk of bleeding: no

## 2024-01-08 NOTE — Plan of Care (Signed)
  Problem: Education: Goal: Knowledge of General Education information will improve Description: Including pain rating scale, medication(s)/side effects and non-pharmacologic comfort measures Outcome: Progressing   Problem: Clinical Measurements: Goal: Will remain free from infection Outcome: Progressing   Problem: Coping: Goal: Level of anxiety will decrease Outcome: Progressing   Problem: Pain Managment: Goal: General experience of comfort will improve and/or be controlled Outcome: Progressing   Problem: Safety: Goal: Ability to remain free from injury will improve Outcome: Progressing

## 2024-01-09 ENCOUNTER — Other Ambulatory Visit (HOSPITAL_COMMUNITY): Payer: Self-pay

## 2024-01-09 ENCOUNTER — Encounter (HOSPITAL_COMMUNITY): Payer: Self-pay | Admitting: Orthopedic Surgery

## 2024-01-09 DIAGNOSIS — S82302A Unspecified fracture of lower end of left tibia, initial encounter for closed fracture: Secondary | ICD-10-CM

## 2024-01-09 LAB — BASIC METABOLIC PANEL WITH GFR
Anion gap: 8 (ref 5–15)
BUN: 15 mg/dL (ref 6–20)
CO2: 25 mmol/L (ref 22–32)
Calcium: 8.4 mg/dL — ABNORMAL LOW (ref 8.9–10.3)
Chloride: 100 mmol/L (ref 98–111)
Creatinine, Ser: 0.79 mg/dL (ref 0.61–1.24)
GFR, Estimated: >60 mL/min (ref >60)
Glucose, Bld: 119 mg/dL — ABNORMAL HIGH (ref 70–99)
Potassium: 4.1 mmol/L (ref 3.5–5.1)
Sodium: 133 mmol/L — ABNORMAL LOW (ref 135–145)

## 2024-01-09 LAB — CBC
HCT: 30.3 % — ABNORMAL LOW (ref 39.0–52.0)
Hemoglobin: 10.2 g/dL — ABNORMAL LOW (ref 13.0–17.0)
MCH: 30.9 pg (ref 26.0–34.0)
MCHC: 33.7 g/dL (ref 30.0–36.0)
MCV: 91.8 fL (ref 80.0–100.0)
Platelets: 182 K/uL (ref 150–400)
RBC: 3.3 MIL/uL — ABNORMAL LOW (ref 4.22–5.81)
RDW: 12.1 % (ref 11.5–15.5)
WBC: 10.4 K/uL (ref 4.0–10.5)
nRBC: 0 % (ref 0.0–0.2)

## 2024-01-09 MED ORDER — SENNA 8.6 MG PO TABS
1.0000 | ORAL_TABLET | Freq: Two times a day (BID) | ORAL | 0 refills | Status: AC
  Filled 2024-01-09: qty 28, 14d supply, fill #0

## 2024-01-09 MED ORDER — ACETAMINOPHEN 500 MG PO TABS
1000.0000 mg | ORAL_TABLET | Freq: Three times a day (TID) | ORAL | 0 refills | Status: AC
  Filled 2024-01-09: qty 84, 14d supply, fill #0

## 2024-01-09 MED ORDER — POLYETHYLENE GLYCOL 3350 17 GM/SCOOP PO POWD
17.0000 g | Freq: Every day | ORAL | 0 refills | Status: AC
  Filled 2024-01-09: qty 238, 14d supply, fill #0

## 2024-01-09 MED ORDER — ASPIRIN 81 MG PO CHEW
81.0000 mg | CHEWABLE_TABLET | Freq: Two times a day (BID) | ORAL | Status: DC
  Administered 2024-01-09: 81 mg via ORAL
  Filled 2024-01-09: qty 1

## 2024-01-09 MED ORDER — OXYCODONE HCL 5 MG PO TABS
5.0000 mg | ORAL_TABLET | ORAL | 0 refills | Status: DC | PRN
  Filled 2024-01-09: qty 40, 4d supply, fill #0

## 2024-01-09 MED ORDER — ASPIRIN 81 MG PO CHEW
81.0000 mg | CHEWABLE_TABLET | Freq: Two times a day (BID) | ORAL | 0 refills | Status: AC
  Filled 2024-01-09: qty 180, 90d supply, fill #0

## 2024-01-09 NOTE — Progress Notes (Signed)
 Orthopedic Tech Progress Note Patient Details:  Derrick Lyons July 30, 1975 [id number]  Ortho Devices Type of Ortho Device: Crutches Ortho Device/Splint Location: lle Ortho Device/Splint Interventions: Ordered, Adjustment   Post Interventions Patient Tolerated: Other (comment) (adjusted to patient height.  Already completed crutch training with PT) Instructions Provided: Care of device, Adjustment of device  [name] OTR/L 01/09/2024, 12:26 PM

## 2024-01-09 NOTE — Discharge Instructions (Addendum)
 Orthopedic Surgery Discharge Instructions  Patient name: Derrick Lyons Fracture: left tibia fracture Procedure Performed: left tibia fracture open reduction internal fixation Date of Surgery: 01/09/2024 Surgeon: Ozell Ada, MD  Activity: You should not put any weight on your left lower extremity until instructed to do so by Dr. Ada. Plan on roughly 10-12 weeks of non weight bearing on that extremity.   Incision Care: Your incision site has a wound vac over it. That wound vac should remain in place and dry at all times for a total of two week after surgery. The dressing wound vac needs to be charged regularly to function properly. At a certain point, the wound vac may stop working. Typically, this is around 10-14 days. When this happens, it is okay to remove the dressing and leave the wound open to air. Underneath the wound vac, you will find skin staples. You should leave these staples in place. They will be taken out in the office when the wound has healed. Do not pick, rub, or scrub at them. Do not put cream or lotion over the surgical area. Once the wound vac is off, it is okay to let soap and water run over your incision. Again, do not pick, scrub, or rub at the staples when bathing. Do not submerge (e.g., take a bath, swim, go in a hot tub, etc.) until six weeks after surgery. There may be some bloody drainage from the incision into the canister associated with the wound vac after surgery. This is normal. You do not need to replace the wound vac or do anything different. Continue to leave the wound vac in place until it stops working as instructed above. Should there by issues with the wound vac that you cannot figure out, please call the office so we can provide you with further instructions.   Medications: You have been prescribed oxycodone . This is a narcotic pain medication and should only be taken as prescribed. You should not drink alcohol or operate heavy machinery (including driving)  while taking this medication. The oxycodone  can cause constipation as a side effect. For that reason, you have been prescribed senna and miralax . These are both laxatives. You do not need to take this medication if you develop diarrhea. Should you remain constipated even while taking the senna and miralax , please use the miralax  twice daily. Tylenol  has been prescribed to be taken every 8 hours, which will give you additional pain relief.   You have been prescribed aspirin  as a blood thinner. This medication is to be taken to prevent blood clots. Take 81 milligrams twice daily. You should refrain from using other blood thinners (warfarin, apixaban, plavix, xarelto, etc.) while using the aspirin . You will need to take this medication for a total of 12 weeks after your surgery.   You should not use over-the-counter NSAIDs (ibuprofen , Aleve , Celebrex, naproxen , meloxicam , etc.) for pain relief because aspirin  is a similar medication. There can be side effects including but not limited to kidney injury and ulcers if you take these type of medications with the aspirin .  In order to set expectations for opioid prescriptions, you will only be prescribed opioids for a total of six weeks after surgery and, at two-weeks after surgery, your opioid prescription will start to tapered (decreased dosage and number of pills). If you have ongoing need for opioid medication six weeks after surgery, you will be referred to pain management. If you are already established with a provider that is giving you opioid medications, you  should schedule an appointment with them for six weeks after surgery if you feel you are going to need another prescription. State law only allows for opioid prescriptions one week at a time. If you are running out of opioid medication near the end of the week, please call the office during business hours before running out so I can send you another prescription.   Eating a healthy diet with plenty of  fruits, vegetables, and lean proteins can help with wound healing. You can continue to use ensure or boost type supplements to help with wound healing. Dr. Georgina strongly encourages you to quit all nicotine containing products as use of nicotine increases your risk for post-operative complication and decreases the body's healing potential.   Driving: You should not drive while taking narcotic pain medications. You should start getting back to driving slowly and you may want to try driving in a parking lot before doing anything more.   Diet: You are safe to resume your regular diet after surgery.   Reasons to Call the Office After Surgery: You should feel free to call the office with any concerns or questions you have in the post-operative period, but you should definitely notify the office if you develop: -shortness of breath, chest pain, or trouble breathing -excessive bleeding, drainage, redness, or swelling around the surgical site -fevers, chills, or pain that is getting worse with each passing day -persistent nausea or vomiting -new weakness in the right lower extremity, new or worsening numbness or tingling in the right lower extremity -other concerns about your surgery  Follow Up Appointments: You have a follow up visit scheduled with Dr. Georgina on 01/24/2024 at 3:45pm. The office phone number and location are listed below. Please arrive on time to your appointment.   Office Information:  -Ozell Georgina, MD -Phone number: [phone] -Address: [address]       [place], [place] [address]
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
## 2024-01-09 NOTE — Plan of Care (Signed)
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
## 2024-01-10 ENCOUNTER — Emergency Department (HOSPITAL_COMMUNITY)
Admission: EM | Admit: 2024-01-10 | Discharge: 2024-01-10 | Disposition: A | Discharge status: Home / Self Care | Payer: MEDICAID | Source: Ambulatory Visit | Attending: Emergency Medicine | Admitting: Emergency Medicine

## 2024-01-10 ENCOUNTER — Other Ambulatory Visit: Payer: Self-pay

## 2024-01-10 DIAGNOSIS — Z4800 Encounter for change or removal of nonsurgical wound dressing: Secondary | ICD-10-CM | POA: Diagnosis present

## 2024-01-10 DIAGNOSIS — Z5189 Encounter for other specified aftercare: Secondary | ICD-10-CM

## 2024-01-10 DIAGNOSIS — R202 Paresthesia of skin: Secondary | ICD-10-CM | POA: Diagnosis not present

## 2024-01-10 DIAGNOSIS — Z7982 Long term (current) use of aspirin: Secondary | ICD-10-CM | POA: Diagnosis not present

## 2024-01-10 NOTE — ED Provider Notes (Signed)
 [place] EMERGENCY DEPARTMENT AT [facility] Provider Note   CSN: [id number] Arrival date & time: 01/10/24  0157     Patient presents with: Post-op Problem   Derrick Lyons is a 48 y.o. male.   Patient is a 48 year old male presenting with a wound VAC malfunction.  He had surgery on his left tibia and fibula, then was discharged yesterday.  Since returning home, he has been unable to get his wound VAC to operate properly.  He does report some tingling of his foot, but denies significant discomfort in the leg.       Prior to Admission medications   Medication Sig Start Date End Date Taking? Authorizing Provider  acetaminophen  (TYLENOL ) 500 MG tablet Take 2 tablets (1,000 mg total) by mouth every 8 (eight) hours for 14 days. 01/09/24 01/23/24  Georgina Ozell LABOR, MD  aspirin  81 MG chewable tablet Chew 1 tablet (81 mg total) by mouth 2 (two) times daily. 01/09/24 04/08/24  Georgina Ozell LABOR, MD  oxyCODONE  (OXY IR/ROXICODONE ) 5 MG immediate release tablet Take 1-2 tablets (5-10 mg total) by mouth every 4 (four) hours as needed for moderate pain (pain score 4-6) or severe pain (pain score 7-10). 01/09/24   Georgina Ozell LABOR, MD  polyethylene glycol powder (GLYCOLAX /MIRALAX ) 17 GM/SCOOP powder Dissolve 1 capful (17g) in 4-8 ounces of liquid and take by mouth daily. 01/10/24 01/24/24  Georgina Ozell LABOR, MD  senna (SENOKOT) 8.6 MG TABS tablet Take 1 tablet (8.6 mg total) by mouth 2 (two) times daily for 14 days. 01/09/24 01/23/24  Georgina Ozell LABOR, MD    Allergies: Aleve  [naproxen ]    Review of Systems  All other systems reviewed and are negative.   Updated Vital Signs BP 139/87 (BP Location: Left Arm)   Pulse 63   Temp 98.4 F (36.9 C) (Oral)   Resp 14   SpO2 96%   Physical Exam Vitals and nursing note reviewed.  Constitutional:      Appearance: Normal appearance.  HENT:     Head: Normocephalic.  Pulmonary:     Effort: Pulmonary effort is normal.  Musculoskeletal:      Comments: The left lower extremity has a dressing and wound VAC in place.  DP pulses are easily palpable and motor and sensation are intact throughout the entire foot.  There is no significant swelling or tension of the compartments of the leg.  Skin:    General: Skin is warm and dry.  Neurological:     Mental Status: He is alert and oriented to person, place, and time.     (all labs ordered are listed, but only abnormal results are displayed) Labs Reviewed - No data to display  EKG: None  Radiology: DG Tibia/Fibula Left Result Date: 01/08/2024 CLINICAL DATA:  Elective surgery. EXAM: LEFT TIBIA AND FIBULA - 2 VIEW COMPARISON:  Preoperative imaging FINDINGS: Five fluoroscopic spot views of the left tibia and fibula submitted from the operating room. Medial plate and multi screw fixation of displaced distal tibial fracture. Fluoroscopy time 28.8 seconds. Dose 0.95 mGy. IMPRESSION: Intraoperative fluoroscopy during ORIF of distal tibial fracture. Electronically Signed   By: Andrea Gasman M.D.   On: 01/08/2024 16:16   DG C-Arm 1-60 Min-No Report Result Date: 01/08/2024 Fluoroscopy was utilized by the requesting physician.  No radiographic interpretation.   DG C-Arm 1-60 Min-No Report Result Date: 01/08/2024 Fluoroscopy was utilized by the requesting physician.  No radiographic interpretation.     Procedures   Medications Ordered in  the ED - No data to display                                  Medical Decision Making  Patient's wound VAC began operating shortly after arrival here.  His leg is neurovascularly intact with no concerns for compartment syndrome.  Patient to be discharged with follow-up with his surgeon.     Final diagnoses:  None    ED Discharge Orders     None          Geroldine Berg, MD 01/10/24 [phone]

## 2024-01-10 NOTE — Discharge Instructions (Signed)
 Follow-up with your surgeon if issues with your wound VAC persist.

## 2024-01-10 NOTE — Anesthesia Postprocedure Evaluation (Signed)
 Anesthesia Post Note  Patient: Derrick Lyons  Procedure(s) Performed: OPEN REDUCTION INTERNAL FIXATION (ORIF) TIBIA FRACTURE (Left)     Patient location during evaluation: PACU Anesthesia Type: General Level of consciousness: sedated and patient cooperative Pain management: pain level controlled Vital Signs Assessment: post-procedure vital signs reviewed and stable Respiratory status: spontaneous breathing Cardiovascular status: stable Anesthetic complications: no   No notable events documented.  Last Vitals:  Vitals:   01/09/24 0403 01/09/24 0753  BP: (!) 90/59 96/63  Pulse: (!) 45 (!) 58  Resp: 17 16  Temp: 36.8 C 36.9 C  SpO2: 98% 99%    Last Pain:  Vitals:   01/09/24 1314  TempSrc:   PainSc: 6                  Virlee Stroschein [employer]

## 2024-01-10 NOTE — ED Triage Notes (Signed)
 Pt arrive from home cc of post op leg tingling and wound vac not working. Pt denies any other signs or symptoms at this time.

## 2024-01-10 NOTE — ED Notes (Signed)
 Upon PMS assessment the pts PMS was intact bilaterally in lower extremities and feet.

## 2024-01-14 ENCOUNTER — Other Ambulatory Visit (HOSPITAL_COMMUNITY): Payer: Self-pay

## 2024-01-16 ENCOUNTER — Telehealth: Payer: Self-pay

## 2024-01-16 NOTE — Telephone Encounter (Addendum)
 Patient called stating that his wound vac had stopped working so he removed it and has some swelling and bruising.  Left tibia ORIF on 01/08/2024.  CB#[phone].  Please advise.  Thank you

## 2024-01-17 MED ORDER — OXYCODONE HCL 5 MG PO TABS: 5.0000 mg | ORAL_TABLET | ORAL | 0 refills | Status: DC | PRN

## 2024-01-17 NOTE — Addendum Note (Signed)
 Addended by: GEORGINA SHARPER on: 01/17/2024 10:37 AM   Modules accepted: Orders

## 2024-01-24 ENCOUNTER — Other Ambulatory Visit: Payer: MEDICAID

## 2024-01-24 ENCOUNTER — Ambulatory Visit: Payer: MEDICAID | Admitting: Orthopedic Surgery

## 2024-01-24 DIAGNOSIS — M79605 Pain in left leg: Secondary | ICD-10-CM | POA: Diagnosis not present

## 2024-01-24 MED ORDER — OXYCODONE HCL 5 MG PO TABS: 5.0000 mg | ORAL_TABLET | ORAL | 0 refills | Status: DC | PRN

## 2024-01-24 NOTE — Progress Notes (Signed)
 Orthopedic Surgery Post-operative Office Visit  Procedure: left tibia fracture open reduction internal fixation Date of Surgery: 01/08/2024 (~2 weeks post-op)  Assessment: Patient is a 48 y.o. who is doing as expected after surgery   Plan: -Operative plans complete -Staples removed today in office -Okay to let soap/water run over the incision but do not submerge -NWB LLE -DVT ppx: ASA 81mg  BID -Pain management: oxycodone , tylenol  -Return to office in 4 weeks, x-rays needed at next visit: AP/lateral left tibia  ___________________________________________________________________________   Subjective: Patient has noticed slight improvement in his pain since discharge from the hospital.  He has been ambulating with crutches.  He has been keeping his weight off of the left lower extremity except for 1 instance where he nearly fell and uses his left leg to support himself.  He has not noticed any redness or drainage around his incision.  He is continue to use oxycodone  to control his pain.  Objective:  General: no acute distress, appropriate affect Neurologic: alert, answering questions appropriately, following commands Respiratory: unlabored breathing on room air Skin: incision is well-approximated with no erythema, induration, active/expressible drainage  MSK (LLE): EHL/TA/GSC intact, sensation intact to light touch in sural/saphenous/deep radial/superficial peroneal/tibial nerve distributions, foot warm and well-perfused  Imaging: XRs of the left tibia from 01/24/2024 were independently reviewed and interpreted, showing a comminuted distal one third tibia fracture that is minimally displaced.  There is medial plate fixation spanning the fracture.  No lucency seen around the screws.  No new fracture seen.  No dislocation seen.   Patient name: Derrick Lyons Patient MRN: [id number] Date of visit: 01/24/2024

## 2024-01-29 ENCOUNTER — Telehealth: Payer: Self-pay | Admitting: Orthopedic Surgery

## 2024-01-29 MED ORDER — OXYCODONE HCL 5 MG PO TABS: 5.0000 mg | ORAL_TABLET | ORAL | 0 refills | Status: DC | PRN

## 2024-01-29 NOTE — Telephone Encounter (Signed)
 Pt called and said he needs a refill on pain medication. [phone]

## 2024-01-29 NOTE — Addendum Note (Signed)
 Addended by: GEORGINA SHARPER on: 01/29/2024 04:50 PM   Modules accepted: Orders

## 2024-02-04 ENCOUNTER — Telehealth: Payer: Self-pay | Admitting: Orthopedic Surgery

## 2024-02-04 MED ORDER — OXYCODONE HCL 5 MG PO TABS: 5.0000 mg | ORAL_TABLET | ORAL | 0 refills | Status: AC | PRN

## 2024-02-04 NOTE — Telephone Encounter (Signed)
 Rx refill Oxycodone    [facility]

## 2024-02-11 ENCOUNTER — Telehealth: Payer: Self-pay | Admitting: Orthopedic Surgery

## 2024-02-11 MED ORDER — OXYCODONE HCL 5 MG PO TABS: 5.0000 mg | ORAL_TABLET | ORAL | 0 refills | Status: DC | PRN

## 2024-02-11 NOTE — Telephone Encounter (Signed)
 Rx refill Oxycodone    [facility]

## 2024-02-15 ENCOUNTER — Other Ambulatory Visit: Payer: Self-pay | Admitting: Orthopedic Surgery

## 2024-02-15 MED ORDER — OXYCODONE HCL 5 MG PO TABS: 5.0000 mg | ORAL_TABLET | ORAL | 0 refills | Status: DC | PRN

## 2024-02-15 NOTE — Telephone Encounter (Signed)
 Pt called wanting a refill of Oxycodone . Pharmacy is [facility]. Call back number is [phone].

## 2024-02-20 ENCOUNTER — Telehealth: Payer: Self-pay | Admitting: Orthopedic Surgery

## 2024-02-20 MED ORDER — ACETAMINOPHEN 500 MG PO TABS: 1000.0000 mg | ORAL_TABLET | Freq: Three times a day (TID) | ORAL | 0 refills | Status: AC

## 2024-02-20 MED ORDER — OXYCODONE HCL 5 MG PO TABS: 5.0000 mg | ORAL_TABLET | ORAL | 0 refills | Status: DC | PRN

## 2024-02-20 MED ORDER — SENNA 8.6 MG PO TABS: 1.0000 | ORAL_TABLET | Freq: Two times a day (BID) | ORAL | 0 refills | Status: AC

## 2024-02-20 NOTE — Telephone Encounter (Signed)
 Pt called asking for refills of Snna, oxycodone  and pt also asked for acetaminophin. Pt pharmacy os [facility]. Pt number is [phone].

## 2024-02-25 ENCOUNTER — Telehealth: Payer: Self-pay | Admitting: Orthopedic Surgery

## 2024-02-25 MED ORDER — OXYCODONE HCL 5 MG PO TABS: 5.0000 mg | ORAL_TABLET | ORAL | 0 refills | Status: DC | PRN

## 2024-02-25 NOTE — Telephone Encounter (Signed)
Patient called. Would like a refill on Oxycodone.

## 2024-02-29 ENCOUNTER — Other Ambulatory Visit: Payer: Self-pay | Admitting: Orthopedic Surgery

## 2024-02-29 MED ORDER — HYDROCODONE-ACETAMINOPHEN 5-325 MG PO TABS: 1.0000 | ORAL_TABLET | ORAL | 0 refills | Status: DC | PRN

## 2024-02-29 NOTE — Telephone Encounter (Signed)
 Pt called wanting a refill of his medication. Medication is Oxycodone  5. Pharmacy is [facility] in [place]. Call back number is [phone].

## 2024-03-05 ENCOUNTER — Telehealth: Payer: Self-pay | Admitting: Orthopedic Surgery

## 2024-03-05 MED ORDER — HYDROCODONE-ACETAMINOPHEN 5-325 MG PO TABS: 1.0000 | ORAL_TABLET | Freq: Four times a day (QID) | ORAL | 0 refills | Status: AC | PRN

## 2024-03-05 NOTE — Telephone Encounter (Signed)
 Patient called. He would like hydrocodone  called in for his pain.

## 2024-03-12 ENCOUNTER — Other Ambulatory Visit: Payer: MEDICAID

## 2024-03-12 ENCOUNTER — Ambulatory Visit: Payer: MEDICAID | Admitting: Orthopedic Surgery

## 2024-03-12 DIAGNOSIS — M79605 Pain in left leg: Secondary | ICD-10-CM

## 2024-03-12 MED ORDER — OXYCODONE HCL 5 MG PO TABS: 2.5000 mg | ORAL_TABLET | ORAL | 0 refills | Status: DC | PRN

## 2024-03-12 NOTE — Progress Notes (Signed)
 Orthopedic Surgery Post-operative Office Visit   Procedure: left tibia fracture open reduction internal fixation Date of Surgery: 01/08/2024 (~2 months post-op)   Assessment: Patient is a 49 y.o. who is still having pain in the leg but it is gradually improving     Plan: -Operative plans complete -Okay to submerge the wound at this point -WBAT LLE in boot, can have the boot off when not weight bearing -DVT ppx: ASA 81mg  BID -Prescribed more oxycodone  today, told him we would transition to Norco next -Encouraged nicotine cessation, patient not interested in quitting after explaining that it slows bony healing and increases the chance of nonunion -Return to office in 6 weeks, x-rays needed at next visit: AP/lateral left tibia weight bearing   ___________________________________________________________________________     Subjective: Patient has noticed modest improvement in his leg pain since he was last in the office. He is still using oxycodone  to control his pain but he has been able to decrease the amount he is taking. He has not noticed any redness or drainage around his incision. He has mostly been non weight bearing but states he has had several instances where he has had to put weight down on the leg. He has been using crutches to get around.    Objective:   General: no acute distress, appropriate affect Neurologic: alert, answering questions appropriately, following commands Respiratory: unlabored breathing on room air Skin: incision is well healed with no erythema, induration, active/expressible drainage   MSK (LLE): ankle ROM from 0-30, EHL/TA/GSC intact, sensation intact to light touch in sural/saphenous/deep radial/superficial peroneal/tibial nerve distributions, foot warm and well-perfused   Imaging: XRs of the left tibia from 03/12/2024 were independently reviewed and interpreted, showing minimally displaced distal 1/3 tibia fracture. There is no callus formation seen.  There is medial plate fixation over the distal tibia. There is a subtle lucency around the second most proximal screw in the plate. Otherwise, no lucency seen around the remaining screws. No new fracture seen.      Patient name: Derrick Lyons Patient MRN: [id number] Date of visit: 03/12/24

## 2024-03-18 ENCOUNTER — Telehealth: Payer: Self-pay | Admitting: Orthopedic Surgery

## 2024-03-18 ENCOUNTER — Other Ambulatory Visit: Payer: Self-pay | Admitting: Orthopedic Surgery

## 2024-03-18 MED ORDER — HYDROCODONE-ACETAMINOPHEN 5-325 MG PO TABS: 1.0000 | ORAL_TABLET | ORAL | 0 refills | Status: AC | PRN

## 2024-03-18 NOTE — Telephone Encounter (Signed)
I tried to call but there was no answer °

## 2024-04-23 ENCOUNTER — Ambulatory Visit: Payer: MEDICAID | Admitting: Orthopedic Surgery
